# Patient Record
Sex: Female | Born: 1975 | Race: White | Hispanic: No | Marital: Married | Smoking: Former smoker
Health system: Southern US, Community
[De-identification: ages and names within clinical notes are randomized; demographics above are authoritative.]

## PROBLEM LIST (undated history)

## (undated) DIAGNOSIS — E119 Type 2 diabetes mellitus without complications: Secondary | ICD-10-CM

## (undated) DIAGNOSIS — M94261 Chondromalacia, right knee: Secondary | ICD-10-CM

## (undated) DIAGNOSIS — L649 Androgenic alopecia, unspecified: Secondary | ICD-10-CM

## (undated) DIAGNOSIS — T7840XA Allergy, unspecified, initial encounter: Secondary | ICD-10-CM

## (undated) DIAGNOSIS — Z8489 Family history of other specified conditions: Secondary | ICD-10-CM

## (undated) DIAGNOSIS — Z98811 Dental restoration status: Secondary | ICD-10-CM

## (undated) DIAGNOSIS — N809 Endometriosis, unspecified: Secondary | ICD-10-CM

## (undated) DIAGNOSIS — G4700 Insomnia, unspecified: Secondary | ICD-10-CM

## (undated) DIAGNOSIS — S8990XA Unspecified injury of unspecified lower leg, initial encounter: Secondary | ICD-10-CM

## (undated) HISTORY — DX: Endometriosis, unspecified: N80.9

## (undated) HISTORY — DX: Unspecified injury of unspecified lower leg, initial encounter: S89.90XA

## (undated) HISTORY — DX: Insomnia, unspecified: G47.00

## (undated) HISTORY — PX: OTHER SURGICAL HISTORY: SHX169

## (undated) HISTORY — DX: Allergy, unspecified, initial encounter: T78.40XA

---

## 2000-09-18 ENCOUNTER — Encounter: Admission: RE | Admit: 2000-09-18 | Discharge: 2000-09-18 | Payer: Self-pay | Admitting: Family Medicine

## 2000-10-19 ENCOUNTER — Encounter: Admission: RE | Admit: 2000-10-19 | Discharge: 2000-10-19 | Payer: Self-pay | Admitting: Family Medicine

## 2000-10-19 ENCOUNTER — Encounter: Payer: Self-pay | Admitting: Family Medicine

## 2000-10-27 ENCOUNTER — Encounter: Admission: RE | Admit: 2000-10-27 | Discharge: 2000-10-27 | Payer: Self-pay | Admitting: Family Medicine

## 2000-11-06 ENCOUNTER — Encounter: Admission: RE | Admit: 2000-11-06 | Discharge: 2000-11-06 | Payer: Self-pay | Admitting: Family Medicine

## 2001-04-09 ENCOUNTER — Encounter (INDEPENDENT_AMBULATORY_CARE_PROVIDER_SITE_OTHER): Payer: Self-pay | Admitting: Specialist

## 2001-04-09 ENCOUNTER — Encounter: Admission: RE | Admit: 2001-04-09 | Discharge: 2001-04-09 | Payer: Self-pay | Admitting: Family Medicine

## 2001-07-29 ENCOUNTER — Other Ambulatory Visit: Admission: RE | Admit: 2001-07-29 | Discharge: 2001-07-29 | Payer: Self-pay | Admitting: Obstetrics and Gynecology

## 2001-08-20 ENCOUNTER — Ambulatory Visit (HOSPITAL_COMMUNITY): Admission: RE | Admit: 2001-08-20 | Discharge: 2001-08-20 | Payer: Self-pay | Admitting: Obstetrics and Gynecology

## 2001-08-20 ENCOUNTER — Encounter (INDEPENDENT_AMBULATORY_CARE_PROVIDER_SITE_OTHER): Payer: Self-pay | Admitting: Specialist

## 2001-08-20 HISTORY — PX: DIAGNOSTIC LAPAROSCOPY: SUR761

## 2001-08-20 HISTORY — PX: CHROMOPERTUBATION: SHX6288

## 2002-01-31 ENCOUNTER — Other Ambulatory Visit: Admission: RE | Admit: 2002-01-31 | Discharge: 2002-01-31 | Payer: Self-pay | Admitting: Obstetrics and Gynecology

## 2002-09-02 ENCOUNTER — Inpatient Hospital Stay (HOSPITAL_COMMUNITY): Admission: AD | Admit: 2002-09-02 | Discharge: 2002-09-06 | Payer: Self-pay | Admitting: Obstetrics and Gynecology

## 2002-09-02 ENCOUNTER — Inpatient Hospital Stay (HOSPITAL_COMMUNITY): Admission: AD | Admit: 2002-09-02 | Discharge: 2002-09-02 | Payer: Self-pay | Admitting: Obstetrics and Gynecology

## 2002-09-03 ENCOUNTER — Encounter (INDEPENDENT_AMBULATORY_CARE_PROVIDER_SITE_OTHER): Payer: Self-pay

## 2002-10-18 ENCOUNTER — Other Ambulatory Visit: Admission: RE | Admit: 2002-10-18 | Discharge: 2002-10-18 | Payer: Self-pay | Admitting: Obstetrics and Gynecology

## 2004-10-04 ENCOUNTER — Ambulatory Visit (HOSPITAL_COMMUNITY): Admission: RE | Admit: 2004-10-04 | Discharge: 2004-10-04 | Payer: Self-pay | Admitting: Obstetrics and Gynecology

## 2004-10-25 ENCOUNTER — Other Ambulatory Visit: Admission: RE | Admit: 2004-10-25 | Discharge: 2004-10-25 | Payer: Self-pay | Admitting: Obstetrics and Gynecology

## 2008-09-11 ENCOUNTER — Encounter: Admission: RE | Admit: 2008-09-11 | Discharge: 2008-09-11 | Payer: Self-pay | Admitting: Orthopedic Surgery

## 2009-03-03 HISTORY — PX: KNEE ARTHROSCOPY: SHX127

## 2009-03-28 ENCOUNTER — Encounter: Admission: RE | Admit: 2009-03-28 | Discharge: 2009-03-28 | Payer: Self-pay | Admitting: Orthopedic Surgery

## 2009-04-04 ENCOUNTER — Encounter: Admission: RE | Admit: 2009-04-04 | Discharge: 2009-04-04 | Payer: Self-pay | Admitting: Orthopedic Surgery

## 2009-11-07 ENCOUNTER — Encounter: Admission: RE | Admit: 2009-11-07 | Discharge: 2009-11-07 | Payer: Self-pay | Admitting: Orthopaedic Surgery

## 2010-07-19 NOTE — Op Note (Signed)
[name] Mccarthy, [name] Mccarthy                          ACCOUNT NO.:  [id number]   MEDICAL RECORD NO.:  [id number]                   PATIENT TYPE:  INP   LOCATION:  9134                                 FACILITY:  [facility]   PHYSICIAN:  Dois Davenport A. Rivard, M.D.              DATE OF BIRTH:  [birth year]   DATE OF PROCEDURE:  09/03/2002  DATE OF DISCHARGE:                                 OPERATIVE REPORT   PREOPERATIVE DIAGNOSIS:  Intrauterine pregnancy at 41 weeks with  nonreassuring fetal heart rate.   POSTOPERATIVE DIAGNOSIS:  Intrauterine pregnancy at 41 weeks with  nonreassuring fetal heart rate.   ANESTHESIA:  Epidural, Dr. Jean Rosenthal.   PROCEDURE:  Primary low transverse cesarean section.   SURGEON:  Crist Fat. Rivard, M.D.   ASSISTANTRenaldo Reel. Latham, C.N.M.   ESTIMATED BLOOD LOSS:  500 mL.   DISPOSITION:  This is a 35 year old single white female, gravida 2, para 0,  aborta 1, who was admitted on September 02, 2002, at 40 weeks 5 days, presenting  with increased frequency and intensity of contractions since 1 o'clock in  the afternoon.  After her arrival at maternity admissions, she began leaking  greenish fluid around 7:45 p.m.  She is admitted for spontaneous rupture of  membranes with moderate meconium in early labor with a vaginal exam of 2+  cm, 90% effaced, vertex -1.  Her fetal heart rate at that time was reactive  and reassuring with occasional mild variable deceleration.  She was brought  to labor and delivery, and epidural was placed.  Post-epidural fetal heart  rate decreased to 70-80 beats per minute, which lasted for about six to  eight minutes but resolved with change of position and O2 mask.  She was  then 3 cm, 80% effaced, and vertex a -1, and thick, abundant meconium was  noted.  An IUPC was inserted at that time to start amnioinfusion, and a  scalp electrode was placed.  The fetal heart rate at that time was 130-140  baseline with no deceleration and good variability.  The  decision was made  to monitor closely and with 45-60 minutes of reactive and reassuring heart  rate, Pitocin per low dose may be started to achieve adequate [place]  units.  That was at 11 p.m.  At 3 a.m. on September 03, 2002, fetal heart rate  showed decreased variability with late-appearing decelerations.  Vaginal  exam was then 8-9 cm by nurse.  Amnioinfusion was then discontinued, Pitocin  was discontinued, and a change of position resolved the issue.  Pitocin was  restarted around 7 a.m. this morning for inadequate [place] units, less  than 150, with an unchanged cervical exam at 9 cm.  At that time fetal heart  rate was reassuring.  At 8 o'clock the baby showed a prolonged deceleration  at 70-80 beats per minute for eight minutes before I was called.  This  persisted despite discontinuing Pitocin, oxygen administration, change in  position, and bolus IV.  She was found to be complete at that time.  At my  arrival at 8:10, fetal heart rate was between 100 and 110 beats per minute  with decreased variability and late-appearing decelerations.  Contractions  were every three to four minutes, and the patient was found to be complete,  left OA presentation with posterior asynclitism, station +1 to +2.  At that  time the patient was consented for a trial of forceps delivery, and an  attempt to adequately place a Simpson forceps failed twice.  During that  attempt fetal heart rate went down to 70-80 beats per minute, and thick  meconium was again noted.  A decision was made to proceed with stat cesarean  section.  The patient arrived in the operating room at 8:35, the procedure  started at 8:37, and baby was born at 8:39.   PROCEDURE:  The patient was transferred to the OR table, prepped and draped  in a sterile fashion, and her bladder was already emptied with a Foley  catheter.  After assessing adequate level of anesthesia, we proceeded with a  Pfannenstiel incision, which was brought  down to the fascia.  The fascia was  incised transversely and the linea alba was dissected  Peritoneum was  entered bluntly.  Visceral peritoneum was entered sharply in the low  transverse fashion, allowing [place] to retract bladder flap and safely retract  bladder.  Myometrium was then entered in the low transverse fashion, first  sharply, then bluntly.  Amniotic fluid was thick meconium.  We assisted the  birth of a female infant at 8:39 in left OA presentation.  Mouth and nose  were suctioned with DeLee suction, baby was delivered, cord was clamped with  two Kelly clamps and sectioned, and baby was given to the pediatrician  present in the room.  We proceeded with drawing 20 mL of blood from the  umbilical vein as well as cord pH from the umbilical artery.  Placenta  detached and was expelled spontaneously.  It was complete.  Cord had three  vessels, and placenta was sent to pathology.   We proceeded with the closure of the myometrium in two layers, first with a  running locked suture of 0 Vicryl, then with a Lembert suture of 0 Vicryl  covering the first one.  Hemostasis was assessed and adequate.  Both  paracolic gutters were cleansed, both tubes and ovaries normal.  The pelvis  was profusely irrigated with warm saline, and hemostasis was assessed and  adequate.  Under-fascia hemostasis was completed with cautery, and the  fascia was closed with two running sutures of 0 Vicryl meeting midline.  The  wound was irrigated with warm saline and hemostasis was completed with  cautery, and skin was closed with staples.   Instrument and sponge count was complete x2.  Estimated blood loss 500 mL.   A little girl born at 8:39, was named Trula Ore, received an Apgar of 8 at  one minute and 9 at five minutes, and had a cord pH of 7.20.  Her weight was  6 pounds 8 ounces.   Instrument and sponge count was complete x2.  The procedure was very well- tolerated by the patient, who is taken to the  recovery room in a well and  stable condition.  Crist Fat Rivard, M.D.    SAR/MEDQ  D:  09/03/2002  T:  09/03/2002  Job:  [id number]

## 2010-07-19 NOTE — H&P (Signed)
NAMEGIAH, FICKETT                          ACCOUNT NO.:  [id number]   MEDICAL RECORD NO.:  [id number]                   PATIENT TYPE:  INP   LOCATION:  9170                                 [facility]ACILITY:  [facility]   PHYSICIAN:  Dois Davenport A. Rivard, M.D.              DATE OF BIRTH:  1975/07/18   DATE OF ADMISSION:  DATE OF DISCHARGE:                                HISTORY & PHYSICAL   HISTORY OF PRESENT ILLNESS:  [name] Mccarthy is a 35 year old single white  female gravida 2, para 0, 0-0-1-0 at 60 and 5/7 weeks who presents  complaining of increasing  frequency and intensity of uterine contractions  since about 1 p.m. today. While she has been in maternity admissions this  evening  she has noted leaking of a small amount of green fluid that started  around 7:45 p.m. She denies any nausea or vomiting, headaches or visual  disturbances.   Her pregnancy has been followed  at [facility] by the MD  service and has been essentially uncomplicated although at risk for a  history of abnormal Pap smear, history of endometriosis, first trimester  spotting. She was evaluated earlier today  for complaints of uterine  contractions and was found to be 1.5 cm and 80% and did not change  her  cervix over an hour and a half of observation. She is currently requesting  an epidural for labor. Her group B Strep is negative.   OBSTETRIC/GYNECOLOGIC HISTORY:  Her ob/gyn history, she is a gravida 2, para  0, 0-0-1-0. She had an elective abortion in 1999 with no complications. In  May 2003 she had an abnormal Pap smear and was treated with a colposcopy and  her Pap smears have been normal since then. She has a history of  endometriosis. She had a diagnostic laparoscopy and also had excision of  peritubal cyst and was noted to have 1 nonpatent accessory fallopian tube.   ALLERGIES:  No known drug allergies.   MEDICAL HISTORY:  She reports having had the usual childhood diseases. She  has no medical  history or problems, but does report an occasional urinary  tract infection. She reports a history of smoking prior to this pregnancy,  but none with this pregnancy. Her only surgery was a diagnostic laparoscopy  and the anesthetic had an effect on her blood pressure and gave her  prolonged lethargy.   FAMILY HISTORY:  Significant for maternal grandmother with chronic  hypertension, father with a blood clot.  Genetic history is negative.   SOCIAL HISTORY:  She is single. The father of the baby is [name] Mccarthy and  they are [place]. She denies any illicit drug use, alcohol or smoking with  this pregnancy.   LABORATORY DATA:  Her prenatal labs were blood type A positive, antibody  screen is negative. Her syphilis is negative. Rubella is positive. Hepatitis  B surface antigen is not currently  available. Her HIV is nonreactive. GC and  Chlamydia and group B Strep were all negative at 36 weeks.   PHYSICAL EXAMINATION:  VITAL SIGNS:  Stable, afebrile.  HEENT:  Grossly within normal limits.  HEART:  Regular rate and rhythm.  CHEST:  Clear.  BREASTS: Soft, nontender.  ABDOMEN:  Gravid with uterine contractions every 3 minutes. Fetal heart rate  is overall reactive and reassuring with some occasional  mild variables  noted with uterine contractions. Her uterine contractions are every 3  minutes.  PELVIC:  Her cervix is 2.5 cm, 90% vertex, -1 with moderate meconium stained  fluid.  EXTREMITIES:  Within normal limits.   ASSESSMENT:  1. Intrauterine pregnancy at term.  2. Early labor.  3. Meconium stained fluid.  4. Negative group B Strep.   PLAN:  Per consult with Dr. Dois Davenport Rivard it to admit to labor and delivery  and to follow routine physician orders.     Concha Pyo. Duplantis, C.N.M.              Crist Fat Rivard, M.D.    SJD/MEDQ  D:  09/02/2002  T:  09/02/2002  Job:  [id number]

## 2010-07-19 NOTE — Op Note (Signed)
[facility]  Patient:    [name] Mccarthy, [name] Mccarthy Visit Number: [id number] MRN: [id number]          Service Type: DSU Location: [facility] Attending Physician:  Esmeralda Arthur Dictated by:   Silverio Lay, M.D. Proc. Date: 08/20/01 Admit Date:  08/20/2001 Discharge Date: 08/20/2001                             Operative Report  PREOPERATIVE DIAGNOSES:       Chronic pelvic pain.  POSTOPERATIVE DIAGNOSES:      Two left peritubal cysts, one left nonpatent accessory fallopian tube, posterior cul-de-sac endometriosis, right distal tubal occlusion.  ANESTHESIA:                   General.  PROCEDURE:                    Diagnostic laparoscopy, chromopertubation, peritoneal biopsies x3, excision of two peritubal cysts and one nonpatent accessory fallopian tube, cauterization of endometriosis.  SURGEON:                      Silverio Lay, M.D.  ESTIMATED BLOOD LOSS:         Minimal.  PROCEDURE:                    After being informed of the planned procedure with possible complications including bleeding, infection, injury to bowels, bladder, or ureters, need for laparotomy, informed consent was obtained.  The patient was taken to OR #8 and given general anesthesia with endotracheal intubation.  She was placed in the lithotomy, prepped and draped in a sterile fashion and her bladder was emptied with an in-and-out catheter.  GYN examination reveals an anteverted uterus normal in size and shape and two normal adnexa.  A speculum is placed in the vagina and the anterior lip of the cervix is grasped to place the [employer].  We then proceed with infiltration of the umbilical area using 8 cc of Marcaine 0.25 and performed a semi-elliptical incision of 10 mm with knife.  We insert the Veress needle and insufflate a pneumoperitoneum with a CO2 at a maximum pressure of 15 mmHg. The Veress needle is removed and a 10 mm trocar is inserted with the laparoscope.   Observation:  Anterior cul-de-sac is normal.  Uterus is normal. Right tube appears normal.  Right ovary is normal.  Left tube has two peritubal cysts, one measuring 0.5 cm, the other one measuring 1 cm with a bluish tint, possible torsion?.  We also see at the distal third of the fallopian tube a structure measuring approximately 0.5-1 cm similar to a tube with a fimbrial end.  This appears to be an accessory fallopian tube. Fimbriae on both sides appear normal.  There are no pelvic adhesions. Posterior cul-de-sac is remarkable for thickening of the peritoneum with two small bluish colored lesions of 2 mm each.  This is mostly located close to the right uterosacral ligament.  On the left uterosacral ligament a red lesion is seen also compatible with possible endometriosis.  The rest of the observation is normal including a normal appendix, normal liver, normal gallbladder, normal peritoneal surfaces, and normal omentum.  We decide to place a 5 mm suprapubic trocar after infiltrating with 2 cc of Marcaine 0.25 and this is done under direct visualization.  We start with chromopertubation and note a normal patency of  the left tube, a nonpatency of the left accessory fallopian tube, and a nonpatency of the right tube despite a filling which appears to be rapid up to the fimbrial end of the right tube.  Despite partial occlusion of the left tube, we were not able to see any dye leakage from the right tube.  The methylene blue is then removed from the pelvis and we decide to proceed with excision of the two peritubal cysts and the left nonpatent accessory tube.  This is performed with cauterization of the stalk and removal with scissors.  All specimens are removed through the 5 mm trocar and labeled appropriately.  We perform biopsies in the posterior cul-de-sac and around the right uterosacral ligament with a biopsy forceps, one of the scar tissue and two of the bluish lesions.  We then cauterize  all these areas with the bipolar cautery as well as the lesion previously noted on the left uterosacral ligament.  Hemostasis is adequate.  The 5 mm trocar is removed under direct visualization and we note a satisfactory hemostasis.  We then remove all instruments, evacuate the pneumoperitoneum, and remove the 10 mm trocar.  The fascia of the 10 mm incision is closed with a simple suture of 0 Vicryl and the skin of both incisions is closed with subcuticular sutures of 4-0 Vicryl as well as Steri-Strips.  Instruments and sponge count is complete x2. Estimated blood loss is minimal.  Procedure is well tolerated by the patient who is taken to the recovery room in a well and stable condition. Dictated by:   Silverio Lay, M.D. Attending Physician:  Esmeralda Arthur DD:  08/20/01 TD:  08/22/01 Job: [id number]

## 2010-07-19 NOTE — Discharge Summary (Signed)
NAMESYMIA, HERDT                          ACCOUNT NO.:  [id number]   MEDICAL RECORD NO.:  [id number]                   PATIENT TYPE:  INP   LOCATION:  9134                                 [facility]   PHYSICIAN:  Osborn Coho, M.D.                DATE OF BIRTH:  1975-07-18   DATE OF ADMISSION:  09/02/2002  DATE OF DISCHARGE:  09/06/2002                                 DISCHARGE SUMMARY   ADMITTING DIAGNOSES:  1. Intrauterine pregnancy at term.  2. Early labor.  3. Meconium-stained fluid.   DISCHARGE DIAGNOSES:  1. Intrauterine pregnancy at 41 weeks.  2. Nonreassuring fetal heart rate.  3. Anemia.   PROCEDURE:  1. Primary low transverse cesarean section.  2. Epidural anesthesia.   HOSPITAL COURSE:  Ms. Gritz is a 34 year old gravida 2 para 0-0-1-0 at 62  and five-sevenths weeks who was admitted in early labor on the evening of  September 02, 2002.  She presented with ruptured membranes noting to have meconium-  stained fluid.  Her cervix was 2.5 cm, 90%, vertex at a -1 station.  Fetal  heart rate was reactive and reassuring with occasional mild variables.  She  was admitted for early labor care.  Her pregnancy had been remarkable for:  1. History of abnormal Pap.  2. History of endometriosis.  3. First trimester spotting.   An epidural was placed per patient request.  She had a brief fetal  bradycardia episode after the epidural was placed which resolved with the  usual measures.  The cervix was 3 cm at that time.  Thick meconium was  noted.  Pressure catheter was inserted to start an amnioinfusion and a scalp  electrode was placed.  Good variability and stable baselines were noted.  Early in the morning of September 03, 2002 there were late decelerations noted.  The cervix was 8-9 cm.  Close observation of fetal heart rate was noted.  These did resolve.  At approximately 8:01 a.m. the patient had another  episode of bradycardia for eight minutes down to 70-80 beats per  minute.  The cervix was complete at that time.  Pitocin was turned off.  The usual  measures were noted.  Fetal heart rate resolved to 100-110 with late-  appearing decelerations.  The cervix was complete with the vertex at a +1 to  +2 station with an LOA presentation with posterior asynclitism.  Dr. Estanislado Pandy  discussed with the patient a possible trial of forceps.  The patient did  consent to that.  Dr. Estanislado Pandy attempted to adequately place Simpson forceps  but this was unsuccessful x2.  Fetal heart rate again was down in the 70s to  80s with thick meconium.  The decision was made to proceed to STAT cesarean  section.  This was accomplished over the next four minutes and at 8:39 a.m.  a viable female by the name of Trula Ore was delivered  with Apgars of 8 and  9; weight was 6 pounds 8 ounces; cord pH was 7.2  There was no clear  evidence of the etiology of the decelerations.  Estimated blood loss was 500  mL.  The infant was taken to the full-term nursery; mother was taken to the  recovery room in good condition.  Postoperative day #1 the patient was doing  well; baby was breastfeeding but was having some difficulty latching on.  The patient elected to defer contraceptive decision until six weeks  postpartum.  Her temperature maximum was 100.3 but then that did resolve to  afebrile the rest of the day.  Hemoglobin was 9.7, down from 12.6.  Wbc  count was 15.3, down from 18.8.  Platelets were within normal limits.  The  rest of her physical exam was within normal limits.  By postoperative day #2  the patient was doing well.  She did have some slight dizziness with  ambulation.  Orthostatics were checked and were stable.  Hemoglobin was  repeated and was 10.0.  The infant was taken to NICU late in the afternoon  of September 05, 2002 secondary to jaundice and poor hydration.  By postoperative  day #3 the patient was doing well.  The infant was doing well in NICU.  The  patient did have some issues  with vulvar itching, low back pain, and sore  nipples.  However, these were reviewed with the patient and were found to be  within normal limits.  No additional medications were noted.  She also had  some small hemorrhoids.  These were to be treated with the usual measures.  Her incision was clean, dry, and intact.  She had passed a bowel movement.  Her bleeding was normal.  Her uterus was well involuted.  The decision was  made to discharge the patient home and remove her staples and apply Steri-  Strips.  Multiple questions were reviewed with the patient regarding her  infant.  She was referred to the NICU staff for further information.  The  patient was deemed to have received the full benefit of her hospital stay  and was discharged home on September 06, 2002.   DISCHARGE INSTRUCTIONS:  Per [facility] handout.   DISCHARGE MEDICATIONS:  1. Motrin 600 mg p.o. q.6h. p.r.n. pain.  2. Tylox one to two p.o. q.3-4h. p.r.n. pain.  3. Dibucaine to be applied on a p.r.n. basis.  4. ProctoFoam which the patient has at home may also be used.  5. The patient may use a little external Monistat for vulvar itching.   DISCHARGE FOLLOW-UP:  Will occur in six weeks at [facility].     Renaldo Reel Emilee Hero, C.N.M.                   Osborn Coho, M.D.    VLL/MEDQ  D:  09/06/2002  T:  09/06/2002  Job:  [id number]

## 2011-02-06 ENCOUNTER — Ambulatory Visit (INDEPENDENT_AMBULATORY_CARE_PROVIDER_SITE_OTHER): Payer: Self-pay | Admitting: Family Medicine

## 2011-02-06 DIAGNOSIS — M25569 Pain in unspecified knee: Secondary | ICD-10-CM

## 2011-03-20 ENCOUNTER — Ambulatory Visit: Payer: Self-pay | Admitting: Family Medicine

## 2011-05-01 ENCOUNTER — Ambulatory Visit (INDEPENDENT_AMBULATORY_CARE_PROVIDER_SITE_OTHER): Payer: Self-pay | Admitting: Family Medicine

## 2011-05-01 ENCOUNTER — Encounter: Payer: Self-pay | Admitting: Family Medicine

## 2011-05-01 DIAGNOSIS — S8990XA Unspecified injury of unspecified lower leg, initial encounter: Secondary | ICD-10-CM

## 2011-05-01 DIAGNOSIS — S99919A Unspecified injury of unspecified ankle, initial encounter: Secondary | ICD-10-CM

## 2011-05-01 HISTORY — DX: Unspecified injury of unspecified lower leg, initial encounter: S89.90XA

## 2011-05-01 NOTE — Progress Notes (Signed)
36 yo married woman from [place] who is orthodox and married a Catholic man.  She has one daughter who is 10 and has straight A's.  Her family lives in [place] and has disowned her because she married outside the faith.  She cannot work because of the knee.  She hasn't spoken with her husband, a truck driver, in weeks.  She feels she has no friends.  She belongs to [facility], but just goes through the motions and doesn't feel connected to the [employer].  Her daughter was recently confirmed though, so she continues to attend services.  She's thinking about leaving her husband, but she has nowhere to go.  Her husband even took her off his car insurance coverage.  O:  [name] Mccarthy cries throughout the interview.  She does perk up when talking about her daughter and is committed to seeing her daughter go through school.  She agrees to return in a month and start Prozac 20 qd with 6 refills  The knee is mildly swollen, nontender, with good ligamentous stability and no crepitus throughout ROM.  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

## 2011-06-05 ENCOUNTER — Ambulatory Visit: Payer: Self-pay | Admitting: Family Medicine

## 2011-07-03 ENCOUNTER — Ambulatory Visit: Payer: Self-pay | Admitting: Family Medicine

## 2011-08-28 ENCOUNTER — Ambulatory Visit: Payer: Self-pay | Admitting: Family Medicine

## 2011-10-09 ENCOUNTER — Ambulatory Visit: Payer: Self-pay | Admitting: Family Medicine

## 2011-12-11 ENCOUNTER — Ambulatory Visit: Payer: Self-pay | Admitting: Family Medicine

## 2012-02-12 ENCOUNTER — Ambulatory Visit: Payer: Self-pay | Admitting: Family Medicine

## 2014-01-02 DIAGNOSIS — N979 Female infertility, unspecified: Secondary | ICD-10-CM | POA: Insufficient documentation

## 2014-07-10 ENCOUNTER — Ambulatory Visit (INDEPENDENT_AMBULATORY_CARE_PROVIDER_SITE_OTHER): Payer: BLUE CROSS/BLUE SHIELD | Admitting: Family Medicine

## 2014-07-10 ENCOUNTER — Ambulatory Visit (INDEPENDENT_AMBULATORY_CARE_PROVIDER_SITE_OTHER): Payer: BLUE CROSS/BLUE SHIELD

## 2014-07-10 VITALS — BP 132/60 | HR 71 | Temp 98.1°F | Resp 16 | Ht 61.0 in | Wt 165.4 lb

## 2014-07-10 DIAGNOSIS — M25561 Pain in right knee: Secondary | ICD-10-CM

## 2014-07-10 MED ORDER — MELOXICAM 7.5 MG PO TABS: 7.5000 mg | ORAL_TABLET | Freq: Every day | ORAL | Status: DC

## 2014-07-10 NOTE — Progress Notes (Signed)
Subjective:  This chart was scribed for Elvina SidleKurt Lauenstein MD,  by Veverly FellsHatice Demirci,scribe, at [facility].  This patient was seen in room 11and the patient's care was started at 8:54 PM.    Patient ID: [name] Mccarthy, female    DOB: 1976/01/06, 39 y.o.   MRN: [id number] Chief Complaint  Patient presents with   Knee Pain    right    HPI  HPI Comments: [name] Mccarthy is a 39 y.o. female who presents to the [facility] complaining of right knee pain onset yesterday when she was lifting a 24 pack of bottled waters. She has associated symptoms of numbness and she has swelling in her knee but says that this is regular.   Patient had knee surgery in 2013 and states that she has always had issues with her knee.  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 She has no other complaints today.      Patient Active Problem List   Diagnosis Date Noted   Knee injury 05/01/2011   History reviewed. No pertinent past medical history. Past Surgical History  Procedure Laterality Date   Other surgical history      knee 2011   Allergies  Allergen Reactions   Hydrocodone-Homatropine     Increase heart rate, severe vomiting, itchy skin.   Oxycodone     Nausea, vomiting, increase heart rate.   Prior to Admission medications   Medication Sig Start Date End Date Taking? Authorizing Provider  ibuprofen (ADVIL,MOTRIN) 100 MG tablet Take 100 mg by mouth every 6 (six) hours as needed.   Yes Historical Provider, MD   History   Social History   Marital Status: Single    Spouse Name: N/A   Number of Children: N/A   Years of Education: N/A   Occupational History   Not on file.   Social History Main Topics   Smoking status: Former Smoker   Smokeless tobacco: Not on file   Alcohol Use: No   Drug Use: No   Sexual Activity: Not on file   Other Topics Concern   Not on file     Social History Narrative      Review of Systems  Constitutional: Negative for fever and chills.  HENT: Negative for nosebleeds, postnasal drip and rhinorrhea.   Respiratory: Negative for cough, choking and shortness of breath.   Gastrointestinal: Negative for nausea and vomiting.  Musculoskeletal: Positive for arthralgias. Negative for neck pain and neck stiffness.  Neurological: Positive for numbness.       Objective:   Physical Exam  Constitutional: She appears well-developed.  HENT:  Head: Normocephalic.  Eyes: Pupils are equal, round, and reactive to light.  Neck: Normal range of motion.  Cardiovascular: Normal rate.   Pulmonary/Chest: Effort normal.  Musculoskeletal:  Right leg: she is mildly diffusely tender but the ligaments are in tact She has no definite bony tenderness.  Full ROM with small amount of crepitous No overlying skin rash.   Skin: Skin is warm. No rash noted.    Filed Vitals:   07/10/14 2028  BP: 132/60  Pulse: 71  Temp: 98.1 F (36.7 C)  TempSrc: Oral  Resp: 16  Height: 5\' 1"  (1.549 m)  Weight: 165 lb 6.4 oz (75.025 kg)  SpO2: 99%   [facility] reading (PRIMARY) by  Dr. Milus GlazierLauenstein:  2 screws are present with small amount of effusion, no  other bony abnormality noted..      Assessment & Plan:   This chart was scribed in my presence and reviewed by me personally.    ICD-9-CM ICD-10-CM   1. Right knee pain 719.46 M25.561 DG Knee Complete 4 Views Right     DG Knee Complete 4 Views Right     meloxicam (MOBIC) 7.5 MG tablet     Signed, Elvina SidleKurt Lauenstein, MD

## 2014-07-10 NOTE — Patient Instructions (Signed)
I am referring you to Dr. Darrick PennaFields because I think there may be some new exercises and other devices may really help this knee heal without further surgery. Also he is excellent at ultrasound diagnostics to try to figure out where the problem is that is causing so much inflammation

## 2014-07-13 ENCOUNTER — Ambulatory Visit (INDEPENDENT_AMBULATORY_CARE_PROVIDER_SITE_OTHER): Payer: BLUE CROSS/BLUE SHIELD | Admitting: Physician Assistant

## 2014-07-13 VITALS — BP 114/80 | HR 72 | Temp 98.0°F | Resp 16 | Ht 61.0 in | Wt 166.8 lb

## 2014-07-13 DIAGNOSIS — M25561 Pain in right knee: Secondary | ICD-10-CM

## 2014-07-13 MED ORDER — TRAMADOL HCL 50 MG PO TABS: 50.0000 mg | ORAL_TABLET | Freq: Three times a day (TID) | ORAL | Status: DC | PRN

## 2014-07-13 NOTE — Patient Instructions (Signed)

## 2014-07-13 NOTE — Progress Notes (Signed)
   Subjective:    Patient ID: [name] Mccarthy, female    DOB: 1976/02/18, 39 y.o.   MRN: [id number]  HPI Patient presents for a stronger medication for right knee pain that was evaluated 3 days ago. Pain has been present for past 5 days following lifting 24 pack of bottled waters. Pain does not radiate, but is sharp and achy in quality. Feels numbness in knee at times. Says that swelling is unchanged. Denies loss of function/ROM, weakness, or gait change. Had surgery on knee in 2013 and has had problems ever since. Rested knee initially, but went back to work a day earlier than recommended and had 7/10 pain throughout day. Is taking Mobic and icing knee 10x daily.   Med allergy to hydrocodone and oxycodone. Has used tramadol in the past without any allergy sx.    Review of Systems  Constitutional: Negative.   Musculoskeletal: Positive for myalgias, joint swelling and arthralgias. Negative for back pain and gait problem.  Skin: Negative for color change.       Objective:   Physical Exam  Constitutional: She is oriented to person, place, and time. She appears well-developed and well-nourished. No distress.  Blood pressure 114/80, pulse 72, temperature 98 F (36.7 C), temperature source Oral, resp. rate 16, height 5\' 1"  (1.549 m), weight 166 lb 12.8 oz (75.66 kg), last menstrual period 06/14/2014, SpO2 99 %.  HENT:  Head: Normocephalic and atraumatic.  Right Ear: External ear normal.  Left Ear: External ear normal.  Eyes: Conjunctivae are normal. Right eye exhibits no discharge. Left eye exhibits no discharge. No scleral icterus.  Pulmonary/Chest: Effort normal.  Musculoskeletal: Normal range of motion. She exhibits edema.       Right knee: She exhibits swelling, effusion and deformity. She exhibits normal range of motion, no ecchymosis, no laceration, no erythema, no LCL laxity, normal patellar mobility, no bony tenderness, normal meniscus and no MCL laxity. Tenderness found.       Right  ankle: Normal.       Right upper leg: Normal.       Left upper leg: Normal.       Right lower leg: Normal.       Left lower leg: Normal.  Neurological: She is alert and oriented to person, place, and time. She has normal reflexes. No cranial nerve deficit. She exhibits normal muscle tone. Coordination normal.  Skin: Skin is warm and dry. No rash noted. She is not diaphoretic. No erythema. No pallor.      Assessment & Plan:  1. Right knee pain Continue mobic daily and ice/cold pack 3-4x 15-20 min daily. Rest and elevate. Should wear brace when going to drive [place] and when going to be on feet/walking more.  - traMADol (ULTRAM) 50 MG tablet; Take 1 tablet (50 mg total) by mouth every 8 (eight) hours as needed.  Dispense: 30 tablet; Refill: 0   Ileene Allie PA-C  [facility] 07/13/2014 4:10 PM

## 2014-07-17 ENCOUNTER — Ambulatory Visit: Payer: Self-pay | Admitting: Family Medicine

## 2014-08-07 ENCOUNTER — Ambulatory Visit (INDEPENDENT_AMBULATORY_CARE_PROVIDER_SITE_OTHER): Payer: BLUE CROSS/BLUE SHIELD | Admitting: Family Medicine

## 2014-08-07 ENCOUNTER — Encounter: Payer: Self-pay | Admitting: Family Medicine

## 2014-08-07 VITALS — BP 129/81 | Ht 61.0 in | Wt 164.0 lb

## 2014-08-07 DIAGNOSIS — S8991XA Unspecified injury of right lower leg, initial encounter: Secondary | ICD-10-CM

## 2014-08-08 NOTE — Assessment & Plan Note (Addendum)
I think her right knee was never really totally rehabbed correctly and she never regained full flexion. Her symptoms of knee pain and swelling seem much more troublesome for her and her knee exam would indicate. I suspect there is some component of psychosocial interaction with her knee pain in her other life stressors. I tried to reassure her today that I think her knee will be fine with some diligent rehabilitation exercises. Upper totally want her to work on her knee flexion and for her to be more confident in in the knee area gave her general knee rehabilitation program and hope for focus on lateral step ups and squats. I like to see her back in one month. I spent greater than 50% of our 30 minute office visit in counseling and education as well as reassurance regarding her knee and the fact that I don't think she needs any type of additional surgery or intervention right now. Certainly she does not need to think about a knee replacement.  If she doesn't have some improvement, I would give some consideration to cortical sterile injection although I would not want to be this a recurrent treatment option for her. Potentially, we can also repeat MRI of her knee.

## 2014-08-08 NOTE — Progress Notes (Signed)
Patient ID: [name] Mccarthy, female   DOB: 1975/10/31, 39 y.o.   MRN: [id number]  [name] Mccarthy - 39 y.o. female MRN [id number]  Date of birth: 1975/10/31    SUBJECTIVE:     Right knee pain. Relates she's had problems with her right knee for many many years. Previously had some type of surgery done on the right knee and has had several imaging studies of her knees area does not recall a specific incident that injured her knee and has only had the one surgery. She has never had any corticosteroid-induced is in her knee.  Right knee swells daily and is so painful it keeps her from being ampicillin do her activities that she would like to do, including making it difficult for her to work full-time. Says she cannot stand very long because of the knee pain. Says that it is interfering with many aspects of her life. The knee occasionally clicks and sometimes feels like it's going to catch but does not really give way. ROS:     No unusual weight change, no numbness in her lower extremities, she does have some reported swelling in the right knee on a daily basis but not any swelling in the left knee and no ankle swelling. Denies calf pain. Denies foot pain. The right knee is constantly painful except at night  PERTINENT  PMH / PSH FH / / SH:  Past Medical, Surgical, Social, and Family History Reviewed & Updated in the EMR.  Pertinent findings include:  2011 right knee arthroscopic repair of anterior cruciate ligament. MRI 2 right knee, left knee 1.  MRI right knee 2011, IMPRESSION: 1. ACL repair noted with intact graft. 2. 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 3. Trace knee effusion. 4. Low-level subcutaneous edema anterior to the patellar tendon and tibial tubercle, without osseous edema or significant tendinopathy. Complete x-ray right knee, May 2016: Results:IMPRESSION: 1. No acute findings. 2. Previous ACL repair.  History of anxiety and marital  stressors  OBJECTIVE: BP 129/81 mmHg  Ht 5\' 1"  (1.549 m)  Wt 164 lb (74.39 kg)  BMI 31.00 kg/m2  LMP 06/14/2014  Physical Exam:  Vital signs are reviewed. GEN.: Well-developed female, no acute distress, tearful at times. Seems very anxious and upset when she talks about her knee. KNEES: Bilaterally they're symmetrical. There is no effusion noted. There is a well-healed portal marks from prior arthroscopy on the right. She has full extension and flexion to about 120. Passively I can flex her past 120 and she complains of some pain and stiffness that reproduces her symptoms. Negative for patellar crepitus, mild pain with patellar compression on the right. No excessive patellar mobility is noted in [name] seem to be symmetrical. Quadricep development bilaterally symmetrical but the VMO is not particularly well-developed. Patellar tracking appears to be within the groove. She has diffuse pain to palpation but no specific joint line tenderness. Ligaments intact to varus and valgus stress. Negative Apley grind test.  ULTRASOUND: Very's small, less than 10 mL, amount of fluid in the suprapatellar pouch. The medial meniscus is seen well and has no abnormalities. The lateral meniscus is a little more difficult to see and has a small fragmented area noted but is 90% intact. Popliteal space is benign. Quadricep and patellar tendons are benign.  ASSESSMENT & PLAN:  See problem based charting & AVS for pt instructions.

## 2014-08-11 ENCOUNTER — Ambulatory Visit (INDEPENDENT_AMBULATORY_CARE_PROVIDER_SITE_OTHER): Payer: BLUE CROSS/BLUE SHIELD | Admitting: Physician Assistant

## 2014-08-11 VITALS — BP 120/76 | HR 85 | Temp 98.4°F | Resp 17 | Ht 62.0 in | Wt 169.0 lb

## 2014-08-11 DIAGNOSIS — R300 Dysuria: Secondary | ICD-10-CM | POA: Diagnosis not present

## 2014-08-11 LAB — POCT UA - MICROSCOPIC ONLY
CASTS, UR, LPF, POC: NEGATIVE
CRYSTALS, UR, HPF, POC: NEGATIVE
MUCUS UA: NEGATIVE
YEAST UA: NEGATIVE

## 2014-08-11 LAB — POCT URINALYSIS DIPSTICK
BILIRUBIN UA: NEGATIVE
Glucose, UA: NEGATIVE
Ketones, UA: NEGATIVE
Leukocytes, UA: NEGATIVE
Nitrite, UA: NEGATIVE
PH UA: 6
PROTEIN UA: NEGATIVE
SPEC GRAV UA: 1.01
UROBILINOGEN UA: 0.2

## 2014-08-11 MED ORDER — SULFAMETHOXAZOLE-TRIMETHOPRIM 800-160 MG PO TABS: 1.0000 | ORAL_TABLET | Freq: Two times a day (BID) | ORAL | Status: DC

## 2014-08-11 NOTE — Progress Notes (Signed)
Patient ID: [name] Mccarthy, female    DOB: 1975-09-10, 39 y.o.   MRN: [id number]  PCP: [name] Rao, PA-C  Subjective:   Chief Complaint  Patient presents with  . Urinary Tract Infection    started 2 weeks ago    HPI Presents for evaluation of urinary symptoms.  She reports two weeks of urinary frequency, urgency and burning. She was not able to take time from work to come in for evaluation, so has been using OTC AZO, which helps temporarily. She has taken multiple OTC tests for UTI and reports that they have all been positive. Gets a UTI about once a year, always feels like this.  Denies vaginal discharge, back/belly pain, fever, chills, nausea.  Sexually active with one partner. No contraception due to infertility (reports she's been told she will not be able to become pregnant again due to poor egg quality). Her daughter is 86. LMP 07/14/2014.    Review of Systems As above.    Patient Active Problem List   Diagnosis Date Noted  . 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 Knee injury 05/01/2011     Prior to Admission medications   Medication Sig Start Date End Date Taking? Authorizing Provider  ibuprofen (ADVIL,MOTRIN) 100 MG tablet Take 100 mg by mouth every 6 (six) hours as needed.   Yes Historical Provider, MD  traMADol (ULTRAM) 50 MG tablet Take 1 tablet (50 mg total) by mouth every 8 (eight) hours as needed. 07/13/14  Yes [name] R Brewington, PA-C     Allergies  Allergen Reactions  . Hydrocodone-Homatropine     Increase heart rate, severe vomiting, itchy skin.  Marland Kitchen Hydrocodone-Homatropine     Syrup  . Oxycodone     Nausea, vomiting, increase heart rate.       Objective:  Physical Exam  Constitutional: She is oriented to person, place, and time. Vital signs are normal. She appears well-developed and well-nourished. No distress.  BP 120/76 mmHg  Pulse 85  Temp(Src) 98.4 F (36.9 C) (Oral)  Resp 17  Ht  (1.575 m)  Wt 169 lb (76.658 kg)   BMI 30.90 kg/m2  SpO2 98%  LMP 07/14/2014   HENT:  Head: Normocephalic and atraumatic.  Cardiovascular: Normal rate, regular rhythm and normal heart sounds.   Pulmonary/Chest: Effort normal and breath sounds normal.  Abdominal: Soft. Normal appearance and bowel sounds are normal. She exhibits no distension and no mass. There is no hepatosplenomegaly. There is tenderness in the suprapubic area. There is no rigidity, no rebound, no guarding, no CVA tenderness, no tenderness at McBurney's point and negative Murphy's sign. No hernia.  Musculoskeletal: Normal range of motion.       Lumbar back: Normal.  Neurological: She is alert and oriented to person, place, and time.  Skin: Skin is warm and dry. No rash noted. She is not diaphoretic. No pallor.  Psychiatric: She has a normal mood and affect. Her speech is normal and behavior is normal. Judgment normal.   Results for orders placed or performed in visit on 08/11/14  POCT UA - Microscopic Only  Result Value Ref Range   WBC, Ur, HPF, POC 1-4    RBC, urine, microscopic 1-2    Bacteria, U Microscopic trace    Mucus, UA neg    Epithelial cells, urine per micros 4-8    Crystals, Ur, HPF, POC neg    Casts, Ur, LPF, POC neg    Yeast, UA neg   POCT urinalysis dipstick  Result  Value Ref Range   Color, UA yellow    Clarity, UA clear    Glucose, UA neg    Bilirubin, UA neg    Ketones, UA neg    Spec Grav, UA 1.010    Blood, UA trace-lysed    pH, UA 6.0    Protein, UA neg    Urobilinogen, UA 0.2    Nitrite, UA neg    Leukocytes, UA Negative            Assessment & Plan:   1. Dysuria Suspect UTI, so will treat pending culture results. - POCT UA - Microscopic Only - POCT urinalysis dipstick - sulfamethoxazole-trimethoprim (BACTRIM DS,SEPTRA DS) 800-160 MG per tablet; Take 1 tablet by mouth 2 (two) times daily.  Dispense: 10 tablet; Refill: 0 - Urine culture   [name] Bras, PA-C Physician Assistant-Certified [facility] .

## 2014-08-11 NOTE — Patient Instructions (Signed)

## 2014-08-12 LAB — URINE CULTURE

## 2014-11-28 ENCOUNTER — Ambulatory Visit (INDEPENDENT_AMBULATORY_CARE_PROVIDER_SITE_OTHER): Payer: BLUE CROSS/BLUE SHIELD | Admitting: Family Medicine

## 2014-11-28 ENCOUNTER — Ambulatory Visit
Admission: RE | Admit: 2014-11-28 | Discharge: 2014-11-28 | Disposition: A | Discharge status: Home / Self Care | Payer: BLUE CROSS/BLUE SHIELD | Source: Ambulatory Visit | Attending: Family Medicine | Admitting: Family Medicine

## 2014-11-28 VITALS — BP 144/70 | HR 77 | Temp 98.9°F | Resp 18 | Ht 61.5 in | Wt 171.2 lb

## 2014-11-28 DIAGNOSIS — N949 Unspecified condition associated with female genital organs and menstrual cycle: Secondary | ICD-10-CM | POA: Diagnosis not present

## 2014-11-28 DIAGNOSIS — N921 Excessive and frequent menstruation with irregular cycle: Secondary | ICD-10-CM

## 2014-11-28 DIAGNOSIS — N979 Female infertility, unspecified: Secondary | ICD-10-CM

## 2014-11-28 DIAGNOSIS — N809 Endometriosis, unspecified: Secondary | ICD-10-CM | POA: Diagnosis not present

## 2014-11-28 DIAGNOSIS — N9489 Other specified conditions associated with female genital organs and menstrual cycle: Secondary | ICD-10-CM

## 2014-11-28 DIAGNOSIS — N938 Other specified abnormal uterine and vaginal bleeding: Secondary | ICD-10-CM

## 2014-11-28 LAB — POCT URINALYSIS DIP (MANUAL ENTRY)
Bilirubin, UA: NEGATIVE
Glucose, UA: NEGATIVE
Ketones, POC UA: NEGATIVE
NITRITE UA: NEGATIVE
PH UA: 5.5
PROTEIN UA: NEGATIVE
Spec Grav, UA: >=1.03
Urobilinogen, UA: 0.2

## 2014-11-28 LAB — TSH: TSH: 2.029 u[IU]/mL (ref 0.350–4.500)

## 2014-11-28 LAB — POCT WET + KOH PREP
TRICH BY WET PREP: ABSENT
Yeast by KOH: ABSENT
Yeast by wet prep: ABSENT

## 2014-11-28 LAB — POCT CBC
Granulocyte percent: 68.9 %G (ref 37–80)
HCT, POC: 40.9 % (ref 37.7–47.9)
Hemoglobin: 13.3 g/dL (ref 12.2–16.2)
Lymph, poc: 1.8 (ref 0.6–3.4)
MCH: 29.9 pg (ref 27–31.2)
MCHC: 32.5 g/dL (ref 31.8–35.4)
MCV: 92.1 fL (ref 80–97)
MID (CBC): 0.3 (ref 0–0.9)
MPV: 6.2 fL (ref 0–99.8)
PLATELET COUNT, POC: 331 10*3/uL (ref 142–424)
POC Granulocyte: 4.8 (ref 2–6.9)
POC LYMPH %: 26.3 % (ref 10–50)
POC MID %: 4.8 % (ref 0–12)
RBC: 4.44 M/uL (ref 4.04–5.48)
RDW, POC: 12.7 %
WBC: 6.9 10*3/uL (ref 4.6–10.2)

## 2014-11-28 LAB — POC MICROSCOPIC URINALYSIS (UMFC)

## 2014-11-28 LAB — POCT URINE PREGNANCY: PREG TEST UR: NEGATIVE

## 2014-11-28 MED ORDER — INDOMETHACIN 50 MG PO CAPS: 50.0000 mg | ORAL_CAPSULE | Freq: Three times a day (TID) | ORAL | Status: DC | PRN

## 2014-11-28 NOTE — Patient Instructions (Addendum)
You have an Ultrasound Appt. At [facility] ([address] This afternoon at 3:45. If you have any questions you can call them at [phone].  Metrorrhagia  Metrorrhagia is uterine bleeding at irregular intervals, especially between menstrual periods.  CAUSES   Dysfunctional uterine bleeding.  Uterine lining growing outside the uterus (endometriosis).  Embryo adhering to uterine wall (implantation).  Pregnancy growing in the fallopian tubes (ectopic pregnancy).  Miscarriage.  Menopause.  Cancer of the reproduction organs.  Certain drugs such as hormonal contraceptives.  Inherited bleeding disorders.  Trauma.  Uterine fibroids.  Sexually transmitted diseases (STDs).  Polycystic ovarian disease. DIAGNOSIS  A history will be taken.  A physical exam will be performed.  Other tests may include:  Blood tests.  A pregnancy test.  An ultrasound of the abdomen and pelvis.  A biopsy of the uterine lining.  AMRI or CT scan of the abdomen and pelvis. TREATMENT Treatment will depend on the cause. HOME CARE INSTRUCTIONS   Take all medicines as directed by your caregiver. Do not change or switch medicines without talking to your caregiver.  Take all iron supplements exactly as directed by your caregiver. Iron supplements help to replace the iron your body loses from irregular bleeding.If you become constipated, increase the amount of fiber, fruits, and vegetables in your diet.  Do not take aspirin or medicines that contain aspirin for 1 week before your menstrual period or during your menstrual period. Aspirin may increase the bleeding.  Rest as much as possible if you change your sanitary pad or tampon more than once every 2 hours.  Eat well-balanced meals including foods high in iron, such as green leafy vegetables, red meat, liver, eggs, and whole-grain breads and cereals.  Do not try to lose weight until the abnormal bleeding is controlled and your  blood iron level is back to normal. SEEK MEDICAL CARE IF:   You have nausea and vomiting, or you cannot keep foods down.  You feel dizzy or have diarrhea while taking medicine.  You have any problems that may be related to the medicine you are taking. SEEK IMMEDIATE MEDICAL CARE IF:   You have a fever.  You develop chills.  You become lightheaded or faint.  You need to change your sanitary pad or tampon more than once an hour.  Your bleeding becomesheavy.  You begin to pass clots or tissue. MAKE SURE YOU:   Understand these instructions.  Will watch your condition.  Will get help right away if you are not doing well or get worse. Document Released: 02/17/2005 Document Revised: 05/12/2011 Document Reviewed: 09/16/2010 [facility] Patient Information 2015 [place], [place]. This information is not intended to replace advice given to you by your health care provider. Make sure you discuss any questions you have with your health care provider.

## 2014-11-28 NOTE — Progress Notes (Signed)
Subjective:  This chart was scribed for Norberto Sorenson, MD by Broadus John, Medical Scribe. This patient was seen in Room 11 and the patient's care was started at 11:05 AM.   Patient ID: [name] Mccarthy, female    DOB: 06-25-1975, 39 y.o.   MRN: [id number]  Chief Complaint  Patient presents with  . Abdominal Pain    lower abdominal pain  - spotting in the last 2 weeks    HPI HPI Comments: [name] Mccarthy is a 39 y.o. female who presents to [facility] complaining of menstrual problems, onset 2 weeks ago.  Pt states that she has been spotting since her period last menstrual period was about 2 weeks ago (09/11), she states that cycle had no change than normal. She indicates that her cycle usually last 3-5 days, and the severity of it depends on the time. Pt notes associates symptoms of severe lower abdominal pain that causes her difficulties with walking, fatigue, and dizziness. Pt denies abnormal vaginal discharge, dysuria, bowel complications, nausea, vomiting, flank pain, fever, and chills. Pt notes that she is currently sexually active, she does not indicates that sexual intercourse is painful. Pt states that she has been trying to conceive for years. Pt is not on any regular medications.   [place]    Patient Active Problem List   Diagnosis Date Noted  . 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 Knee injury 05/01/2011   History reviewed. No pertinent past medical history. Past Surgical History  Procedure Laterality Date  . Other surgical history      knee 2011   Allergies  Allergen Reactions  . Hydrocodone-Homatropine     Increase heart rate, severe vomiting, itchy skin.  Marland Kitchen Hydrocodone-Homatropine     Syrup  . Oxycodone     Nausea, vomiting, increase heart rate.   Prior to Admission medications   Medication Sig Start Date End Date Taking? Authorizing Provider  ibuprofen (ADVIL,MOTRIN) 100 MG tablet Take 100 mg by mouth every 6 (six) hours as  needed.   Yes Historical Provider, MD   Social History   Social History  . Marital Status: Significant Other    Spouse Name: n/a  . Number of Children: 1  . Years of Education: N/A   Occupational History  . Paralegal    Social History Main Topics  . Smoking status: Former Games developer  . Smokeless tobacco: Never Used  . Alcohol Use: No  . Drug Use: No  . Sexual Activity: Not on file   Other Topics Concern  . Not on file   Social History Narrative   From [place]   Came to the [place] in 2000   Lives with her boyfriend (since 2000) and daughter.    Review of Systems  Constitutional: Positive for activity change and fatigue. Negative for fever, chills and diaphoresis.  Gastrointestinal: Positive for abdominal pain. Negative for nausea, vomiting, diarrhea and constipation.  Genitourinary: Positive for vaginal bleeding, menstrual problem and pelvic pain. Negative for dysuria, urgency, frequency, flank pain, vaginal discharge, difficulty urinating, vaginal pain and dyspareunia.  Musculoskeletal: Positive for arthralgias.  Neurological: Positive for dizziness.      Objective:   Physical Exam  Constitutional: She is oriented to person, place, and time. She appears well-developed and well-nourished. No distress.  HENT:  Head: Normocephalic and atraumatic.  Eyes: EOM are normal. Pupils are equal, round, and reactive to light.  Neck: Neck supple.  Cardiovascular: Normal rate.   Pulmonary/Chest: Effort normal.  Abdominal: Bowel sounds are normal. There is tenderness (generalized tenderness but most severe in the suprapubic and  LLQ areas).  Genitourinary: Vagina normal.  Normal labia majora and minora. Cervix with small amount dark mucoid blood coming from the os.  No cervical motion tenderness, but uterine felt firm and severely tender to palpation, but not fixed or enlarged. Question of left adnexal mass, though less adnexal tenderness of uterine.    Neurological: She is alert and  oriented to person, place, and time. No cranial nerve deficit.  Skin: Skin is warm and dry.  Psychiatric: She has a normal mood and affect. Her behavior is normal.  Nursing note and vitals reviewed.  BP 144/70 mmHg  Pulse 77  Temp(Src) 98.9 F (37.2 C) (Oral)  Resp 18  Ht 5' 1.5" (1.562 m)  Wt 171 lb 3.2 oz (77.656 kg)  BMI 31.83 kg/m2  SpO2 99%  LMP 11/12/2014     Assessment & Plan:  . 1. Metrorrhagia   2. Dysfunctional uterine bleeding - suspect sxs due to recurrence of endometriosis so will get [place] and pt will f/u w/ gyn.  3. Endometriosis   4. Female infertility, secondary - appt with fertility specialist next wk.  5. Uterine pain     Orders Placed This Encounter  Procedures  . Urine culture  . GC/Chlamydia Probe Amp  . US Pelvis Complete    171LBS/NO NEEDS/INS/BCBS/HB & ABBY    Standing Status: Future     Number of Occurrences: 1     Standing Expiration Date: 01/28/2016    Order Specific Question:  Reason for Exam (SYMPTOM  OR DIAGNOSIS REQUIRED)    Answer:  severe uterine pain and metrorrhagia x 2 wks - r/o endometrial cancer, ovarian cyst, molar pregnancy    Order Specific Question:  Preferred imaging location?    Answer:  [facility]  . US Transvaginal Non-OB    Standing Status: Future     Number of Occurrences: 1     Standing Expiration Date: 01/28/2016    Order Specific Question:  Reason for Exam (SYMPTOM  OR DIAGNOSIS REQUIRED)    Answer:  severe uterine pain and metrorrhagia x 2 wks - r/o endometrial cancer, ovarian cyst, molar pregnancy    Order Specific Question:  Preferred imaging location?    Answer:  [facility]  . TSH  . POCT CBC  . POCT urine pregnancy  . POCT urinalysis dipstick  . POCT Microscopic Urinalysis ([facility])  . POCT Wet + KOH Prep ([facility])    Meds ordered this encounter  Medications  . indomethacin (INDOCIN) 50 MG capsule    Sig: Take 1 capsule (50 mg total) by mouth 3 (three) times daily as needed.    Dispense:  60  capsule    Refill:  0    I personally performed the services described in this documentation, which was scribed in my presence. The recorded information has been reviewed and considered, and addended by me as needed.  Norberto Sorenson, MD MPH  By signing my name below, I, Rawaa Al Rifaie, attest that this documentation has been prepared under the direction and in the presence of Norberto Sorenson, MD.  Broadus John, Medical Scribe. 11/28/2014.  11:17 AM.  Results for orders placed or performed in visit on 11/28/14  Urine culture  Result Value Ref Range   Colony Count 2,000 COLONIES/ML    Organism ID, Bacteria Insignificant Growth   GC/Chlamydia Probe Amp  Result Value Ref Range   CT  Probe RNA NEGATIVE    GC Probe RNA NEGATIVE   TSH  Result Value Ref Range   TSH 2.029 0.350 - 4.500 uIU/mL  POCT CBC  Result Value Ref Range   WBC 6.9 4.6 - 10.2 K/uL   Lymph, poc 1.8 0.6 - 3.4   POC LYMPH PERCENT 26.3 10 - 50 %L   MID (cbc) 0.3 0 - 0.9   POC MID % 4.8 0 - 12 %M   POC Granulocyte 4.8 2 - 6.9   Granulocyte percent 68.9 37 - 80 %G   RBC 4.44 4.04 - 5.48 M/uL   Hemoglobin 13.3 12.2 - 16.2 g/dL   HCT, POC 40.9 81.1 - 47.9 %   MCV 92.1 80 - 97 fL   MCH, POC 29.9 27 - 31.2 pg   MCHC 32.5 31.8 - 35.4 g/dL   RDW, POC 91.4 %   Platelet Count, POC 331 142 - 424 K/uL   MPV 6.2 0 - 99.8 fL  POCT urine pregnancy  Result Value Ref Range   Preg Test, Ur Negative Negative  POCT urinalysis dipstick  Result Value Ref Range   Color, UA yellow yellow   Clarity, UA clear clear   Glucose, UA negative negative   Bilirubin, UA negative negative   Ketones, POC UA negative negative   Spec Grav, UA >=1.030    Blood, UA moderate (A) negative   pH, UA 5.5    Protein Ur, POC negative negative   Urobilinogen, UA 0.2    Nitrite, UA Negative Negative   Leukocytes, UA Trace (A) Negative  POCT Microscopic Urinalysis ([facility])  Result Value Ref Range   WBC,UR,HPF,POC Moderate (A) None WBC/hpf   RBC,UR,HPF,POC Few  (A) None RBC/hpf   Bacteria Moderate (A) None   Mucus Present (A) Absent   Epithelial Cells, UR Per Microscopy Moderate (A) None cells/hpf  POCT Wet + KOH Prep ([facility])  Result Value Ref Range   Yeast by KOH Absent Present, Absent   Yeast by wet prep Absent Present, Absent   WBC by wet prep Moderate (A) None, Few   Clue Cells Wet Prep HPF POC None None   Trich by wet prep Absent Present, Absent   Bacteria Wet Prep HPF POC Few None, Few   Epithelial Cells By [employer] Pref ([facility]) Many (A) None, Few   RBC,UR,HPF,POC Few (A) None RBC/hpf

## 2014-11-29 LAB — URINE CULTURE

## 2014-11-29 LAB — GC/CHLAMYDIA PROBE AMP
CT PROBE, AMP APTIMA: NEGATIVE
GC PROBE AMP APTIMA: NEGATIVE

## 2014-11-30 ENCOUNTER — Encounter: Payer: Self-pay | Admitting: Family Medicine

## 2014-11-30 ENCOUNTER — Ambulatory Visit (INDEPENDENT_AMBULATORY_CARE_PROVIDER_SITE_OTHER): Payer: BLUE CROSS/BLUE SHIELD | Admitting: Family Medicine

## 2014-11-30 VITALS — BP 112/60 | HR 86 | Temp 98.8°F | Resp 18 | Ht 61.5 in | Wt 173.0 lb

## 2014-11-30 DIAGNOSIS — N809 Endometriosis, unspecified: Secondary | ICD-10-CM | POA: Diagnosis not present

## 2014-11-30 DIAGNOSIS — R102 Pelvic and perineal pain: Secondary | ICD-10-CM | POA: Diagnosis not present

## 2014-11-30 DIAGNOSIS — N921 Excessive and frequent menstruation with irregular cycle: Secondary | ICD-10-CM

## 2014-11-30 LAB — POCT CBC
Granulocyte percent: 67.5 %G (ref 37–80)
HCT, POC: 40.7 % (ref 37.7–47.9)
Hemoglobin: 13.2 g/dL (ref 12.2–16.2)
Lymph, poc: 1.6 (ref 0.6–3.4)
MCH: 29.9 pg (ref 27–31.2)
MCHC: 32.5 g/dL (ref 31.8–35.4)
MCV: 92.1 fL (ref 80–97)
MID (CBC): 0.4 (ref 0–0.9)
MPV: 6.1 fL (ref 0–99.8)
PLATELET COUNT, POC: 307 10*3/uL (ref 142–424)
POC Granulocyte: 4.1 (ref 2–6.9)
POC LYMPH %: 26 % (ref 10–50)
POC MID %: 6.5 % (ref 0–12)
RBC: 4.42 M/uL (ref 4.04–5.48)
RDW, POC: 12.7 %
WBC: 6 10*3/uL (ref 4.6–10.2)

## 2014-11-30 NOTE — Progress Notes (Signed)
[facility] [address], [place] [address] [phone]  Date:  11/30/2014   Name:  [name] Mccarthy   DOB:  Jul 28, 1975   MRN:  [id number]  PCP:  Hinton Rao, PA-C    Chief Complaint: Follow-up   History of Present Illness:  [name] Mccarthy is a 39 y.o. very pleasant female patient who presents with the following:  Seen here 2 days ago with concern about menorrhagia- note from that visit is not yet complete.  They did start indomethacin 50 TID, she also had an ultrasound that was normal.  Her GYN is Dr. Gwenlyn Fudge.   She reports that she is bleeding heavily again over the last 2 days.  She may change her pad every 3-4 hours. She is passing clots as well.   The clots are new to her.  Her menses had been ok until this month- since the 11th of this month she has had more consistent bleeding.  She also has a history of endometriosis.    She did vomit this am- once.   She is able to eat ok.   She has noted some loose stools She has not been aware of a fever but has felt hot and had chills.   She has not had this much pain from her endometriosis in the past  History of C/S and exlap X2  Results for orders placed or performed in visit on 11/28/14  Urine culture  Result Value Ref Range   Colony Count 2,000 COLONIES/ML    Organism ID, Bacteria Insignificant Growth   GC/Chlamydia Probe Amp  Result Value Ref Range   CT Probe RNA NEGATIVE    GC Probe RNA NEGATIVE   TSH  Result Value Ref Range   TSH 2.029 0.350 - 4.500 uIU/mL  POCT CBC  Result Value Ref Range   WBC 6.9 4.6 - 10.2 K/uL   Lymph, poc 1.8 0.6 - 3.4   POC LYMPH PERCENT 26.3 10 - 50 %L   MID (cbc) 0.3 0 - 0.9   POC MID % 4.8 0 - 12 %M   POC Granulocyte 4.8 2 - 6.9   Granulocyte percent 68.9 37 - 80 %G   RBC 4.44 4.04 - 5.48 M/uL   Hemoglobin 13.3 12.2 - 16.2 g/dL   HCT, POC 65.7 84.6 - 47.9 %   MCV 92.1 80 - 97 fL   MCH, POC 29.9 27 - 31.2 pg   MCHC 32.5 31.8 - 35.4 g/dL   RDW,  POC 96.2 %   Platelet Count, POC 331 142 - 424 K/uL   MPV 6.2 0 - 99.8 fL  POCT urine pregnancy  Result Value Ref Range   Preg Test, Ur Negative Negative  POCT urinalysis dipstick  Result Value Ref Range   Color, UA yellow yellow   Clarity, UA clear clear   Glucose, UA negative negative   Bilirubin, UA negative negative   Ketones, POC UA negative negative   Spec Grav, UA >=1.030    Blood, UA moderate (A) negative   pH, UA 5.5    Protein Ur, POC negative negative   Urobilinogen, UA 0.2    Nitrite, UA Negative Negative   Leukocytes, UA Trace (A) Negative  POCT Microscopic Urinalysis ([facility])  Result Value Ref Range   WBC,UR,HPF,POC Moderate (A) None WBC/hpf   RBC,UR,HPF,POC Few (A) None RBC/hpf   Bacteria Moderate (A) None   Mucus Present (A) Absent   Epithelial Cells, UR Per Microscopy Moderate (  A) None cells/hpf  POCT Wet + KOH Prep ([facility])  Result Value Ref Range   Yeast by KOH Absent Present, Absent   Yeast by wet prep Absent Present, Absent   WBC by wet prep Moderate (A) None, Few   Clue Cells Wet Prep HPF POC None None   Trich by wet prep Absent Present, Absent   Bacteria Wet Prep HPF POC Few None, Few   Epithelial Cells By [employer] Pref ([facility]) Many (A) None, Few   RBC,UR,HPF,POC Few (A) None RBC/hpf     Patient Active Problem List   Diagnosis Date Noted  . 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 Knee injury 05/01/2011    History reviewed. No pertinent past medical history.  Past Surgical History  Procedure Laterality Date  . Other surgical history      knee 2011    Social History  Substance Use Topics  . Smoking status: Former Games developer  . Smokeless tobacco: Never Used  . Alcohol Use: No    Family History  Problem Relation Age of Onset  . Arthritis Mother     Allergies  Allergen Reactions  . Hydrocodone-Homatropine     Increase heart rate, severe vomiting, itchy skin.  Marland Kitchen Hydrocodone-Homatropine     Syrup  . Oxycodone     Nausea, vomiting,  increase heart rate.    Medication list has been reviewed and updated.  Current Outpatient Prescriptions on File Prior to Visit  Medication Sig Dispense Refill  . ibuprofen (ADVIL,MOTRIN) 100 MG tablet Take 100 mg by mouth every 6 (six) hours as needed.    . indomethacin (INDOCIN) 50 MG capsule Take 1 capsule (50 mg total) by mouth 3 (three) times daily as needed. 60 capsule 0   No current facility-administered medications on file prior to visit.    Review of Systems:  As per HPI- otherwise negative.   Physical Examination: Filed Vitals:   11/30/14 0828  BP: 112/60  Pulse: 86  Temp: 98.8 F (37.1 C)  Resp: 18   Filed Vitals:   11/30/14 0828  Height: 5' 1.5" (1.562 m)  Weight: 173 lb (78.472 kg)   Body mass index is 32.16 kg/(m^2). Ideal Body Weight: Weight in (lb) to have BMI = 25: 134.2  GEN: WDWN, NAD, Non-toxic, A & O x 3, obese HEENT: Atraumatic, Normocephalic. Neck supple. No masses, No LAD. Ears and Nose: No external deformity. CV: RRR, No M/G/R. No JVD. No thrill. No extra heart sounds. PULM: CTA B, no wheezes, crackles, rhonchi. No retractions. No resp. distress. No accessory muscle use. ABD: S, ND, +BS. No rebound. No HSM.  She has tenderness over the lower pelvis/ suprapubic area.  No RLQ or LLQ tenderness  EXTR: No c/c/e NEURO Normal gait.  PSYCH: Normally interactive. Conversant. Not depressed or anxious appearing.  Calm demeanor.   Results for orders placed or performed in visit on 11/30/14  POCT CBC  Result Value Ref Range   WBC 6.0 4.6 - 10.2 K/uL   Lymph, poc 1.6 0.6 - 3.4   POC LYMPH PERCENT 26.0 10 - 50 %L   MID (cbc) 0.4 0 - 0.9   POC MID % 6.5 0 - 12 %M   POC Granulocyte 4.1 2 - 6.9   Granulocyte percent 67.5 37 - 80 %G   RBC 4.42 4.04 - 5.48 M/uL   Hemoglobin 13.2 12.2 - 16.2 g/dL   HCT, POC 16.1 09.6 - 47.9 %   MCV 92.1 80 - 97 fL   MCH, POC  29.9 27 - 31.2 pg   MCHC 32.5 31.8 - 35.4 g/dL   RDW, POC 11.9 %   Platelet Count, POC 307  142 - 424 K/uL   MPV 6.1 0 - 99.8 fL    Assessment and Plan: Menorrhagia with irregular cycle - Plan: POCT CBC  Pelvic pain in female  Endometriosis  We were able to get her an appt with her OBG today- she will attend this appt.   CBC is stable Did not repeat pelvic exam as she is going to her OBG today  Signed Abbe Amsterdam, MD

## 2014-11-30 NOTE — Patient Instructions (Signed)
You will be seen by your OB-GYN office today- appt at 10:30, please arrive by [facility]:?   Address: [address], [place], [place] [address]  Phone: [phone]  Your red cell counts are stable from yesterday  If your OBGYN feels that you need a CT scan I am glad to help arrange this for you

## 2014-12-02 DIAGNOSIS — E2839 Other primary ovarian failure: Secondary | ICD-10-CM | POA: Insufficient documentation

## 2014-12-20 ENCOUNTER — Other Ambulatory Visit: Payer: Self-pay | Admitting: Obstetrics and Gynecology

## 2014-12-20 DIAGNOSIS — R109 Unspecified abdominal pain: Secondary | ICD-10-CM

## 2014-12-25 ENCOUNTER — Ambulatory Visit
Admission: RE | Admit: 2014-12-25 | Discharge: 2014-12-25 | Disposition: A | Discharge status: Home / Self Care | Payer: BLUE CROSS/BLUE SHIELD | Source: Ambulatory Visit | Attending: Obstetrics and Gynecology | Admitting: Obstetrics and Gynecology

## 2014-12-25 DIAGNOSIS — R109 Unspecified abdominal pain: Secondary | ICD-10-CM

## 2014-12-25 MED ORDER — IOPAMIDOL (ISOVUE-300) INJECTION 61%
100.0000 mL | Freq: Once | INTRAVENOUS | Status: AC | PRN
  Administered 2014-12-25: 100 mL via INTRAVENOUS

## 2015-03-08 DIAGNOSIS — N979 Female infertility, unspecified: Secondary | ICD-10-CM

## 2016-10-20 ENCOUNTER — Ambulatory Visit: Payer: BLUE CROSS/BLUE SHIELD | Admitting: Physician Assistant

## 2016-10-21 ENCOUNTER — Ambulatory Visit (INDEPENDENT_AMBULATORY_CARE_PROVIDER_SITE_OTHER): Payer: BLUE CROSS/BLUE SHIELD | Admitting: Physician Assistant

## 2016-10-21 ENCOUNTER — Encounter: Payer: Self-pay | Admitting: Physician Assistant

## 2016-10-21 VITALS — BP 120/88 | HR 76 | Temp 99.3°F | Resp 16 | Ht 61.75 in | Wt 177.6 lb

## 2016-10-21 DIAGNOSIS — N898 Other specified noninflammatory disorders of vagina: Secondary | ICD-10-CM

## 2016-10-21 DIAGNOSIS — B9689 Other specified bacterial agents as the cause of diseases classified elsewhere: Secondary | ICD-10-CM | POA: Diagnosis not present

## 2016-10-21 DIAGNOSIS — N760 Acute vaginitis: Secondary | ICD-10-CM

## 2016-10-21 DIAGNOSIS — L298 Other pruritus: Secondary | ICD-10-CM

## 2016-10-21 DIAGNOSIS — B3731 Acute candidiasis of vulva and vagina: Secondary | ICD-10-CM

## 2016-10-21 DIAGNOSIS — R350 Frequency of micturition: Secondary | ICD-10-CM

## 2016-10-21 DIAGNOSIS — B373 Candidiasis of vulva and vagina: Secondary | ICD-10-CM | POA: Diagnosis not present

## 2016-10-21 LAB — POCT WET + KOH PREP
Trich by wet prep: ABSENT
Yeast by KOH: ABSENT
Yeast by wet prep: ABSENT

## 2016-10-21 LAB — POC MICROSCOPIC URINALYSIS (UMFC): Mucus: ABSENT

## 2016-10-21 LAB — POCT URINALYSIS DIP (MANUAL ENTRY)
Bilirubin, UA: NEGATIVE
Glucose, UA: NEGATIVE mg/dL
Ketones, POC UA: NEGATIVE mg/dL
Leukocytes, UA: NEGATIVE
Nitrite, UA: NEGATIVE
Protein Ur, POC: NEGATIVE mg/dL
Spec Grav, UA: >=1.03 — AB (ref 1.010–1.025)
Urobilinogen, UA: 0.2 U/dL
pH, UA: 5.5 (ref 5.0–8.0)

## 2016-10-21 MED ORDER — METRONIDAZOLE 500 MG PO TABS: 500.0000 mg | ORAL_TABLET | Freq: Two times a day (BID) | ORAL | 0 refills | Status: DC

## 2016-10-21 MED ORDER — FLUCONAZOLE 150 MG PO TABS: 150.0000 mg | ORAL_TABLET | Freq: Once | ORAL | 1 refills | Status: AC

## 2016-10-21 NOTE — Patient Instructions (Addendum)
For vaginal dryness and Vaginal Burning: Replense or Vagisil feminine moisturizer, coconut oil  Glycerin also has anti-yeast properties Vaginal moisturizers are intended for use one or more times per week, not just during sexual activity. Many moisturizer products are available; examples include Replens, Me Again, Vagisil Feminine Moisturizer, Feminease, and K-Y SILK-E.   Thank you for coming in today. I hope you feel we met your needs.  Feel free to call PCP if you have any questions or further requests.  Please consider signing up for MyChart if you do not already have it, as this is a great way to communicate with me.  Best,  Whitney McVey, PA-C   Bacterial Vaginosis Bacterial vaginosis is an infection of the vagina. It happens when too many germs (bacteria) grow in the vagina. This infection puts you at risk for infections from sex (STIs). Treating this infection can lower your risk for some STIs. You should also treat this if you are pregnant. It can cause your baby to be born early. Follow these instructions at home: Medicines  Take over-the-counter and prescription medicines only as told by your doctor.  Take or use your antibiotic medicine as told by your doctor. Do not stop taking or using it even if you start to feel better. General instructions  If you your sexual partner is a woman, tell her that you have this infection. She needs to get treatment if she has symptoms. If you have a female partner, he does not need to be treated.  During treatment: ? Avoid sex. ? Do not douche. ? Avoid alcohol as told. ? Avoid breastfeeding as told.  Drink enough fluid to keep your pee (urine) clear or pale yellow.  Keep your vagina and butt (rectum) clean. ? Wash the area with warm water every day. ? Wipe from front to back after you use the toilet.  Keep all follow-up visits as told by your doctor. This is important. Preventing this condition  Do not douche.  Use only warm water to  wash around your vagina.  Use protection when you have sex. This includes: ? Latex condoms. ? Dental dams.  Limit how many people you have sex with. It is best to only have sex with the same person (be monogamous).  Get tested for STIs. Have your partner get tested.  Wear underwear that is cotton or lined with cotton.  Avoid tight pants and pantyhose. This is most important in summer.  Do not use any products that have nicotine or tobacco in them. These include cigarettes and e-cigarettes. If you need help quitting, ask your doctor.  Do not use illegal drugs.  Limit how much alcohol you drink. Contact a doctor if:  Your symptoms do not get better, even after you are treated.  You have more discharge or pain when you pee (urinate).  You have a fever.  You have pain in your belly (abdomen).  You have pain with sex.  Your bleed from your vagina between periods. Summary  This infection happens when too many germs (bacteria) grow in the vagina.  Treating this condition can lower your risk for some infections from sex (STIs).  You should also treat this if you are pregnant. It can cause early (premature) birth.  Do not stop taking or using your antibiotic medicine even if you start to feel better. This information is not intended to replace advice given to you by your health care provider. Make sure you discuss any questions you have with your  health care provider. Document Released: 11/27/2007 Document Revised: 11/03/2015 Document Reviewed: 11/03/2015 Elsevier Interactive Patient Education  2017 [employer].  IF you received an x-ray today, you will receive an invoice from [facility]. Please contact [facility] at [phone] with questions or concerns regarding your invoice.   IF you received labwork today, you will receive an invoice from [facility]. Please contact [facility] at [phone] with questions or concerns regarding your invoice.   Our  billing staff will not be able to assist you with questions regarding bills from these companies.  You will be contacted with the lab results as soon as they are available. The fastest way to get your results is to activate your My Chart account. Instructions are located on the last page of this paperwork. If you have not heard from [place] regarding the results in 2 weeks, please contact this office.

## 2016-10-21 NOTE — Progress Notes (Signed)
[name] Mccarthy  MRN: [id number] DOB: 05-22-1975  PCP: Patient, No Pcp Per  Subjective:  Pt is a pleasant 41 year old female who presents to clinic for vaginal itching x 10 days.  She has tried probiotics, chamomile tea, baking soda - not helping. Endorses some increased frequency in urination. She is not concerned today about STDs.  Patient denies fever, chills, vaginal discharge, back pain, headache, stomach ache and vaginal discharge.  She is premenopausal and endorses vaginal dryness.   Review of Systems  Constitutional: Negative for chills, fatigue and fever.  Respiratory: Negative for cough, shortness of breath and wheezing.   Cardiovascular: Negative for chest pain and palpitations.  Gastrointestinal: Negative for abdominal pain, diarrhea, nausea and vomiting.  Genitourinary: Positive for frequency. Negative for decreased urine volume, difficulty urinating, dysuria, enuresis, flank pain, hematuria, urgency, vaginal bleeding, vaginal discharge and vaginal pain.  Musculoskeletal: Negative for back pain.  Neurological: Negative for dizziness, weakness, light-headedness and headaches.    Patient Active Problem List   Diagnosis Date Noted  . 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 Knee injury 05/01/2011    Current Outpatient Prescriptions on File Prior to Visit  Medication Sig Dispense Refill  . ibuprofen (ADVIL,MOTRIN) 100 MG tablet Take 100 mg by mouth every 6 (six) hours as needed.    . indomethacin (INDOCIN) 50 MG capsule Take 1 capsule (50 mg total) by mouth 3 (three) times daily as needed. (Patient not taking: Reported on 10/21/2016) 60 capsule 0   No current facility-administered medications on file prior to visit.     Allergies  Allergen Reactions  . Hydrocodone-Homatropine     Increase heart rate, severe vomiting, itchy skin.  Marland Kitchen Hydrocodone-Homatropine     Syrup  . Oxycodone     Nausea, vomiting, increase heart rate.     Objective:  BP (!) 143/79   Pulse  76   Temp 99.3 F (37.4 C) (Oral)   Resp 16   Ht 5' 1.75" (1.568 m)   Wt 177 lb 9.6 oz (80.6 kg)   LMP 10/01/2016   SpO2 96%   BMI 32.75 kg/m   Physical Exam  Constitutional: She is oriented to person, place, and time and well-developed, well-nourished, and in no distress. No distress.  Cardiovascular: Normal rate, regular rhythm and normal heart sounds.   Abdominal: Soft. Normal appearance. There is no tenderness. There is no CVA tenderness.  Neurological: She is alert and oriented to person, place, and time. GCS score is 15.  Skin: Skin is warm and dry.  Psychiatric: Mood, memory, affect and judgment normal.  Vitals reviewed.  Results for orders placed or performed in visit on 10/21/16  POCT urinalysis dipstick  Result Value Ref Range   Color, UA yellow yellow   Clarity, UA clear clear   Glucose, UA negative negative mg/dL   Bilirubin, UA negative negative   Ketones, POC UA negative negative mg/dL   Spec Grav, UA >=2.951 (A) 1.010 - 1.025   Blood, UA trace-lysed (A) negative   pH, UA 5.5 5.0 - 8.0   Protein Ur, POC negative negative mg/dL   Urobilinogen, UA 0.2 0.2 or 1.0 E.U./dL   Nitrite, UA Negative Negative   Leukocytes, UA Negative Negative  POCT Microscopic Urinalysis ([facility])  Result Value Ref Range   WBC,UR,HPF,POC None None WBC/hpf   RBC,UR,HPF,POC None None RBC/hpf   Bacteria Few (A) None, Too numerous to count   Mucus Absent Absent   Epithelial Cells, UR Per Microscopy Few (A) None, Too numerous  to count cells/hpf  POCT Wet + KOH Prep  Result Value Ref Range   Yeast by KOH Absent Absent   Yeast by wet prep Absent Absent   WBC by wet prep None (A) Few   Clue Cells Wet Prep HPF POC Few (A) None   Trich by wet prep Absent Absent   Bacteria Wet Prep HPF POC Moderate (A) Few   Epithelial Cells By [employer] Pref ([facility]) Moderate (A) None, Few, Too numerous to count   RBC,UR,HPF,POC None None RBC/hpf    Assessment and Plan :  1. BV (bacterial vaginosis) 2. Vagina  itching 3. Increased frequency of urination - metroNIDAZOLE (FLAGYL) 500 MG tablet; Take 1 tablet (500 mg total) by mouth 2 (two) times daily with a meal. DO NOT CONSUME ALCOHOL WHILE TAKING THIS MEDICATION.  Dispense: 14 tablet; Refill: 0 - POCT Wet + KOH Prep - POCT urinalysis dipstick - POCT Microscopic Urinalysis ([facility]) - Urine Culture - urine negative for UTI. Culture is pending. Will treat today for BV. RTC in 5-7 days if no improvement. She agrees with plan.  4. Vaginal yeast infection - fluconazole (DIFLUCAN) 150 MG tablet; Take 1 tablet (150 mg total) by mouth once. Repeat if needed  Dispense: 2 tablet; Refill: 1 - H/o yeast infections with antibiotic use. OK to Rx diflucan.    Marco Collie, PA-C  [facility] 10/21/2016 9:55 AM

## 2016-10-22 LAB — URINE CULTURE

## 2017-01-05 ENCOUNTER — Encounter: Payer: Self-pay | Admitting: Family Medicine

## 2017-01-05 ENCOUNTER — Ambulatory Visit: Payer: BLUE CROSS/BLUE SHIELD | Admitting: Family Medicine

## 2017-01-05 ENCOUNTER — Other Ambulatory Visit: Payer: Self-pay

## 2017-01-05 VITALS — BP 100/64 | HR 76 | Temp 98.4°F | Resp 16 | Ht 61.75 in | Wt 160.4 lb

## 2017-01-05 DIAGNOSIS — L659 Nonscarring hair loss, unspecified: Secondary | ICD-10-CM | POA: Diagnosis not present

## 2017-01-05 DIAGNOSIS — L853 Xerosis cutis: Secondary | ICD-10-CM | POA: Diagnosis not present

## 2017-01-05 MED ORDER — HYDROCORTISONE VALERATE 0.2 % EX OINT: 1.0000 "application " | TOPICAL_OINTMENT | Freq: Every day | CUTANEOUS | 1 refills | Status: DC

## 2017-01-05 MED ORDER — AMMONIUM LACTATE 12 % EX CREA: TOPICAL_CREAM | CUTANEOUS | 0 refills | Status: DC | PRN

## 2017-01-05 NOTE — Patient Instructions (Addendum)
IF you received an x-ray today, you will receive an invoice from [facility]. Please contact [facility] at [phone] with questions or concerns regarding your invoice.   IF you received labwork today, you will receive an invoice from [facility]. Please contact [facility] at [phone] with questions or concerns regarding your invoice.   Our billing staff will not be able to assist you with questions regarding bills from these companies.  You will be contacted with the lab results as soon as they are available. The fastest way to get your results is to activate your My Chart account. Instructions are located on the last page of this paperwork. If you have not heard from [place] regarding the results in 2 weeks, please contact this office.     Hydrocortisone skin cream, ointment, lotion, or solution What is this medicine? HYDROCORTISONE (hye droe KOR ti sone) is a corticosteroid. It is used on the skin to reduce swelling, redness, itching, and allergic reactions. This medicine may be used for other purposes; ask your health care provider or pharmacist if you have questions. COMMON BRAND NAME(S): Ala-Cort, Ala-Scalp, Anusol HC, Aqua Glycolic HC, Balneol for Her, Caldecort, Cetacort, Cortaid, Cortaid Advanced, Cortaid Intensive Therapy, Cortaid Sensitive Skin, CortAlo, Corticaine, Corticool, Cortizone, Cortizone-10, Cortizone-10 Cooling Relief, Cortizone-10 Intensive Healing, Cortizone-10 Plus, Dermarest Dricort, Dermarest Eczema, DERMASORB HC Complete, Gly-Cort, Hycort, Hydro Skin, Hydroskin, Hytone, Instacort, Lacticare HC, Locoid, Locoid Lipocream, MiCort-HC, Monistat Complete Care Instant Itch Relief Cream, Neosporin Eczema, NuCort, Nutracort, NuZon, Pandel, Pediaderm HC, Penecort, Preparation H Hydrocortisone, Procto-Kit, Procto-Med HC, Proctocream-HC, Proctosol-HC, Proctozone-HC, Rederm, Sarnol-HC, Nurse, adult, Engineer, site, Contractor, Tucks HC, Human resources officer What should I tell  my health care provider before I take this medicine? They need to know if you have any of these conditions: -any active infection -diabetes -large areas of burned or damaged skin -skin wasting or thinning -an unusual or allergic reaction to hydrocortisone, corticosteroids, sulfites, other medicines, foods, dyes, or preservatives -pregnant or trying to get pregnant -breast-feeding How should I use this medicine? This medicine is for external use only. Do not take by mouth. Follow the directions on the prescription label. Wash your hands before and after use. Apply a thin film of medicine to the affected area. Do not cover with a bandage or dressing unless your doctor or health care professional tells you to. Do not use on healthy skin or over large areas of skin. Do not get this medicine in your eyes. If you do, rinse out with plenty of cool tap water. Do not to use more medicine than prescribed. Do not use your medicine more often than directed or for more than 14 days. Talk to your pediatrician regarding the use of this medicine in children. Special care may be needed. While this drug may be prescribed for children as young as 67 years of age for selected conditions, precautions do apply. Do not use this medicine for the treatment of diaper rash unless directed to do so by your doctor or health care professional. If applying this medicine to the diaper area of a child, do not cover with tight-fitting diapers or plastic pants. This may increase the amount of medicine that passes through the skin and increase the risk of serious side effects. Elderly patients are more likely to have damaged skin through aging, and this may increase side effects. This medicine should only be used for brief periods and infrequently in older patients. Overdosage: If you think you have taken too much of this medicine contact  a poison control center or emergency room at once. NOTE: This medicine is only for you. Do not share  this medicine with others. What if I miss a dose? If you miss a dose, use it as soon as you can. If it is almost time for your next dose, use only that dose. Do not use double or extra doses. What may interact with this medicine? Interactions are not expected. Do not use any other skin products on the affected area without asking your doctor or health care professional. This list may not describe all possible interactions. Give your health care provider a list of all the medicines, herbs, non-prescription drugs, or dietary supplements you use. Also tell them if you smoke, drink alcohol, or use illegal drugs. Some items may interact with your medicine. What should I watch for while using this medicine? Tell your doctor or health care professional if your symptoms do not start to get better within 7 days or if they get worse. Tell your doctor or health care professional if you are exposed to anyone with measles or chickenpox, or if you develop sores or blisters that do not heal properly. What side effects may I notice from receiving this medicine? Side effects that you should report to your doctor or health care professional as soon as possible: -allergic reactions like skin rash, itching or hives, swelling of the face, lips, or tongue -burning feeling on the skin -dark red spots on the skin -infection -lack of healing of skin condition -painful, red, pus filled blisters in hair follicles -thinning of the skin Side effects that usually do not require medical attention (report to your doctor or health care professional if they continue or are bothersome): -dry skin, irritation -unusual increased growth of hair on the face or body This list may not describe all possible side effects. Call your doctor for medical advice about side effects. You may report side effects to FDA at 1-800-FDA-1088. Where should I keep my medicine? Keep out of the reach of children. Store at room temperature between 15  and 30 degrees C (59 and 86 degrees F). Do not freeze. Throw away any unused medicine after the expiration date. NOTE: This sheet is a summary. It may not cover all possible information. If you have questions about this medicine, talk to your doctor, pharmacist, or health care provider.  2018 Elsevier/Gold Standard (2007-07-02 16:08:48)

## 2017-01-05 NOTE — Progress Notes (Signed)
Subjective:    Patient ID: [name] Mccarthy, female    DOB: 27-Oct-1975, 41 y.o.   MRN: [id number] Chief Complaint  Patient presents with  . Eczema    face is getting worse, seen a dermatologist last year     HPI   Was seen at dermatology specialists at [facility] - [name], [place]. Told to use otc moisturizers. Tried supp with collagen, fish oil, 3L of water/d,  Wants to know if there iss omething wrong with her blood that could be the cause.  Feels like she has somewthing over her face that prevents absorption. No rx medicines. Started in her 35s but worse ov erthe years - esp the past 3 years Uses a homemade soap amde from olive oil from [employer].  Put on ctream 2.5 hrs ago and now dry again.  Not in any specific areas, is the entire face. Ues coconut oil on skin and works well. Had tried everytype of otc moisturiziner w/o benefit.  Premenopausal Hormone imbalance - as perimenopausal - so taking estrogen supplements patch and dose has to frequently increase.   Started on calcium/vit D sev mos ago.     History reviewed. No pertinent past medical history. Past Surgical History:  Procedure Laterality Date  . CESAREAN SECTION    . KNEE SURGERY    . OTHER SURGICAL HISTORY     knee 2011   Current Outpatient Medications on File Prior to Visit  Medication Sig Dispense Refill  . Calcium Carb-Cholecalciferol (CALCIUM 500/D) 500-400 MG-UNIT CHEW Chew by mouth.    . estradiol (CLIMARA - DOSED IN MG/24 HR) 0.0375 mg/24hr patch Place 0.0375 mg onto the skin once a week.    Marland Kitchen ibuprofen (ADVIL,MOTRIN) 100 MG tablet Take 100 mg by mouth every 6 (six) hours as needed.     No current facility-administered medications on file prior to visit.    Allergies  Allergen Reactions  . Hydrocodone-Homatropine     Increase heart rate, severe vomiting, itchy skin.  Marland Kitchen Hydrocodone-Homatropine     Syrup  . Oxycodone     Nausea, vomiting, increase heart rate.   Family History  Problem  Relation Age of Onset  . Arthritis Mother    Social History   Socioeconomic History  . Marital status: Significant Other    Spouse name: n/a  . Number of children: 1  . Years of education: None  . Highest education level: None  Social Needs  . Financial resource strain: None  . Food insecurity - worry: None  . Food insecurity - inability: None  . Transportation needs - medical: None  . Transportation needs - non-medical: None  Occupational History  . Occupation: Paralegal  Tobacco Use  . Smoking status: Former Games developer  . Smokeless tobacco: Never Used  Substance and Sexual Activity  . Alcohol use: No  . Drug use: No  . Sexual activity: None  Other Topics Concern  . None  Social History Narrative   From [place]   Came to the [place] in 2000   Lives with her boyfriend (since 2000) and daughter.    Depression screen [facility] 2/9 10/21/2016 11/30/2014 11/28/2014 08/11/2014 08/07/2014  Decreased Interest 0 0 0 0 0  Down, Depressed, Hopeless 0 0 0 0 0  PHQ - 2 Score 0 0 0 0 0     Review of Systems See hpi    Objective:   Physical Exam  Constitutional: She is oriented to person, place, and time. She appears well-developed and  well-nourished. No distress.  HENT:  Head: Normocephalic and atraumatic.  Right Ear: External ear normal.  Left Ear: External ear normal.  Eyes: Conjunctivae are normal. No scleral icterus.  Neck: Normal range of motion. Neck supple. No thyromegaly present.  Cardiovascular: Normal rate, regular rhythm, normal heart sounds and intact distal pulses.  Pulmonary/Chest: Effort normal and breath sounds normal. No respiratory distress.  Musculoskeletal: She exhibits no edema.  Lymphadenopathy:    She has no cervical adenopathy.  Neurological: She is alert and oriented to person, place, and time.  Skin: Skin is warm and dry. She is not diaphoretic. No erythema.  Psychiatric: She has a normal mood and affect. Her behavior is normal.         BP 100/64   Pulse 76    Temp 98.4 F (36.9 C)   Resp 16   Ht 5' 1.75" (1.568 m)   Wt 160 lb 6.4 oz (72.8 kg)   SpO2 99%   BMI 29.58 kg/m   Assessment & Plan:   1. Dry skin dermatitis   2. Hair loss     Orders Placed This Encounter  Procedures  . CBC with Differential/Platelet  . Comprehensive metabolic panel  . Thyroid Panel With TSH    Meds ordered this encounter  Medications  . hydrocortisone valerate ointment (WEST-CORT) 0.2 %    Sig: Apply 1 application daily topically.    Dispense:  60 g    Refill:  1  . ammonium lactate (AMLACTIN) 12 % cream    Sig: Apply as needed topically for dry skin.    Dispense:  385 g    Refill:  0    [name], M.D.  Primary Care at [facility]  [place] [address] [place], [place] [address] [phone] phone [phone]) [phone] fax  01/08/17 8:12 AM

## 2017-01-06 LAB — CBC WITH DIFFERENTIAL/PLATELET
BASOS ABS: 0 10*3/uL (ref 0.0–0.2)
Basos: 0 %
EOS (ABSOLUTE): 0.1 10*3/uL (ref 0.0–0.4)
Eos: 2 %
Hematocrit: 39.8 % (ref 34.0–46.6)
Hemoglobin: 13.6 g/dL (ref 11.1–15.9)
Immature Grans (Abs): 0 10*3/uL (ref 0.0–0.1)
Immature Granulocytes: 0 %
LYMPHS ABS: 1.9 10*3/uL (ref 0.7–3.1)
LYMPHS: 35 %
MCH: 31.4 pg (ref 26.6–33.0)
MCHC: 34.2 g/dL (ref 31.5–35.7)
MCV: 92 fL (ref 79–97)
Monocytes Absolute: 0.3 10*3/uL (ref 0.1–0.9)
Monocytes: 6 %
NEUTROS ABS: 3.1 10*3/uL (ref 1.4–7.0)
Neutrophils: 57 %
Platelets: 255 10*3/uL (ref 150–379)
RBC: 4.33 x10E6/uL (ref 3.77–5.28)
RDW: 13.6 % (ref 12.3–15.4)
WBC: 5.4 10*3/uL (ref 3.4–10.8)

## 2017-01-06 LAB — COMPREHENSIVE METABOLIC PANEL
A/G RATIO: 2 (ref 1.2–2.2)
ALBUMIN: 4.5 g/dL (ref 3.5–5.5)
ALT: 14 IU/L (ref 0–32)
AST: 13 IU/L (ref 0–40)
Alkaline Phosphatase: 62 IU/L (ref 39–117)
BUN / CREAT RATIO: 10 (ref 9–23)
BUN: 7 mg/dL (ref 6–24)
CHLORIDE: 103 mmol/L (ref 96–106)
CO2: 21 mmol/L (ref 20–29)
Calcium: 9.5 mg/dL (ref 8.7–10.2)
Creatinine, Ser: 0.72 mg/dL (ref 0.57–1.00)
GFR calc non Af Amer: 104 mL/min/{1.73_m2} (ref >59)
GFR, EST AFRICAN AMERICAN: 119 mL/min/{1.73_m2} (ref >59)
Globulin, Total: 2.2 g/dL (ref 1.5–4.5)
Glucose: 105 mg/dL — ABNORMAL HIGH (ref 65–99)
POTASSIUM: 4.9 mmol/L (ref 3.5–5.2)
Sodium: 140 mmol/L (ref 134–144)
TOTAL PROTEIN: 6.7 g/dL (ref 6.0–8.5)

## 2017-01-06 LAB — THYROID PANEL WITH TSH
FREE THYROXINE INDEX: 2.1 (ref 1.2–4.9)
T3 Uptake Ratio: 24 % (ref 24–39)
T4, Total: 8.9 ug/dL (ref 4.5–12.0)
TSH: 1.63 u[IU]/mL (ref 0.450–4.500)

## 2017-02-16 ENCOUNTER — Encounter: Payer: Self-pay | Admitting: Family Medicine

## 2017-02-16 ENCOUNTER — Other Ambulatory Visit: Payer: Self-pay

## 2017-02-16 ENCOUNTER — Ambulatory Visit: Payer: BLUE CROSS/BLUE SHIELD | Admitting: Family Medicine

## 2017-02-16 VITALS — BP 106/66 | HR 84 | Temp 98.7°F | Ht 61.0 in | Wt 156.2 lb

## 2017-02-16 DIAGNOSIS — L853 Xerosis cutis: Secondary | ICD-10-CM | POA: Diagnosis not present

## 2017-02-16 DIAGNOSIS — R151 Fecal smearing: Secondary | ICD-10-CM

## 2017-02-16 DIAGNOSIS — L649 Androgenic alopecia, unspecified: Secondary | ICD-10-CM | POA: Diagnosis not present

## 2017-02-16 DIAGNOSIS — K648 Other hemorrhoids: Secondary | ICD-10-CM | POA: Diagnosis not present

## 2017-02-16 NOTE — Progress Notes (Signed)
Subjective:    Patient ID: [name] Mccarthy, female    DOB: Jan 10, 1976, 41 y.o.   MRN: [id number] Chief Complaint  Patient presents with  . Face    dry skin, "ongoing", pt states "she had bloodwork 6 wks ago, haven't gotten the results"  . leaking poop    per pt always the same comes and goes    HPI Face partially better.  Hair loss continues on significantly.  Taking mvi for women over 50,  Calcium/vit D, probiotics, and collagen.  Dr. Estanislado Pandyivard is doing hormone therapy.  "Is life-saving" as her 30-35 hots flashes and palpitats resolved, mood swings all improved. Sleep great now. She is noting that her hair loss is on the top of her head and her mother had very thinned hair.  Had hemorrhoids ever since her daughter and then several years later it started and has remained constant since. Feces is a tiny amount out of rectum.  It is periodic.  No painful. Usually has 1-2x/d BM w/o any c/d, no straining, no blood.  Has tried Kegel exercises for the rectum without any improvement.   Has lost almost 40lbs and so is on a very special diet.  She is fasting this week so not eating any foods from animals.  Can't exercise with knee - having surgery on 03/13/17  No past medical history on file. Past Surgical History:  Procedure Laterality Date  . CESAREAN SECTION    . KNEE SURGERY    . OTHER SURGICAL HISTORY     knee 2011   Current Outpatient Medications on File Prior to Visit  Medication Sig Dispense Refill  . ammonium lactate (AMLACTIN) 12 % cream Apply as needed topically for dry skin. 385 g 0  . Calcium Carb-Cholecalciferol (CALCIUM 500/D) 500-400 MG-UNIT CHEW Chew by mouth.    . estradiol (CLIMARA - DOSED IN MG/24 HR) 0.0375 mg/24hr patch Place 0.0375 mg onto the skin once a week.    . hydrocortisone valerate ointment (WEST-CORT) 0.2 % Apply 1 application daily topically. 60 g 1  . ibuprofen (ADVIL,MOTRIN) 100 MG tablet Take 100 mg by mouth every 6 (six) hours as needed.     No current  facility-administered medications on file prior to visit.    Allergies  Allergen Reactions  . Hydrocodone-Homatropine     Increase heart rate, severe vomiting, itchy skin.  Marland Kitchen. Hydrocodone-Homatropine     Syrup  . Oxycodone     Nausea, vomiting, increase heart rate.   Family History  Problem Relation Age of Onset  . Arthritis Mother    Social History   Socioeconomic History  . Marital status: Significant Other    Spouse name: n/a  . Number of children: 1  . Years of education: None  . Highest education level: None  Social Needs  . Financial resource strain: None  . Food insecurity - worry: None  . Food insecurity - inability: None  . Transportation needs - medical: None  . Transportation needs - non-medical: None  Occupational History  . Occupation: Paralegal  Tobacco Use  . Smoking status: Former Games developermoker  . Smokeless tobacco: Never Used  Substance and Sexual Activity  . Alcohol use: No  . Drug use: No  . Sexual activity: None  Other Topics Concern  . None  Social History Narrative   From [place].   [place]ame to the [place] in 2000   Lives with her boyfriend (since 2000) and daughter.   [facility] 02/16/2017 02/16/2017 10/21/2016 11/30/2014 11/28/2014  Decreased Interest 0 0 0 0 0  Down, Depressed, Hopeless 0 0 0 0 0  PHQ - 2 Score 0 0 0 0 0     Review of Systems See hpi    Objective:   Physical Exam  Constitutional: She is oriented to person, place, and time. She appears well-developed and well-nourished. No distress.  HENT:  Head: Normocephalic and atraumatic.  Right Ear: External ear normal.  Left Ear: External ear normal.  Eyes: Conjunctivae are normal. No scleral icterus.  Neck: Normal range of motion. Neck supple. No thyromegaly present.  Cardiovascular: Normal rate, regular rhythm, normal heart sounds and intact distal pulses.  Pulmonary/Chest: Effort normal and breath sounds normal. No respiratory distress.  Musculoskeletal: She exhibits no  edema.  Lymphadenopathy:    She has no cervical adenopathy.  Neurological: She is alert and oriented to person, place, and time.  Skin: Skin is warm and dry. She is not diaphoretic. No erythema.  Psychiatric: She has a normal mood and affect. Her behavior is normal.         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  If sxs do not improve after starting vit supp and second opinion to re-eval her current hormone therapy regimen, could try topical minoxidal 5% foam (1/2 capful) qd to scalp (not hair) - massage onto scalp with fingers then wash hands after application. Apply at least 2 hrs before bed to allow adequate drying time and avoid inadvertent spread to other body sites during sleep - needs minimum of 4 mos for visible effect and rec using for at least 12 mos before deciding there has been a trx failure. May have more hair shedding in first 2-8 wks of trx. The daily topical rx testosterone cream she is applying may be making this worse???   2. Hemorrhoids, internal  3. Dry skin dermatitis - slightly improved - cont amlactin. Likely hormonal.  (I can't really appreciate pt's concerns on exam - she endorses her face feeling rough).  4. Fecal smearing - start daily fiber supp for stool bulking.  Rec consider course of pelvic floor PT for anal sphincter strengthening. Consider having hemorrhoid removed/banded as likely exacerbating occurrence at minimum. Pt declines referral to PT or GI for these interventions currently but will call if she becomes ready to proceed.   Recommend she discuss concerns with a provider who has more experience with "homeopathic" and hormone treatments - pt interested in bioidentical hormones - try Dr. Jarold MottoPatterson  at [facility]. Pt has filled progesterone 200mg  #30 on 11/12, and progesterone micronized 100mg  po qd #90 on 02/02/17. As well as estradiol 0.05mg /24hr patch on 11/11 #12 and estradiol 0.1mg /day patch on 11/12 #12 - in addition to testosterone cream 0.1mg  topically daily.  Norberto SorensonEva Reba Hulett, M.D.  Primary [facility] at [facility]  [place] [address] [place], [place] [address] [phone] phone [phone] fax  02/22/17 6:58 PM

## 2017-02-16 NOTE — Patient Instructions (Addendum)
Start IRON SUPPLEMENT in your multivitamin and start BIOTIN supplement as well.   Start a daily fiber supplement to help bulk up your stools so that there is no liquid stool to leak out. Check out Dr. 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 Arnette Norrisheryl Grey. Consider having your internal hemorrhoid removed - this would like stop the stool leakage and would be done at a gastroenterologist or a general surgeons office.    IF you received an x-ray today, you will receive an invoice from [facility]. Please contact [facility] at [phone] with questions or concerns regarding your invoice.   IF you received labwork today, you will receive an invoice from [facility]. Please contact [facility] at [phone] with questions or concerns regarding your invoice.   Our billing staff will not be able to assist you with questions regarding bills from these companies.  You will be contacted with the lab results as soon as they are available. The fastest way to get your results is to activate your My Chart account. Instructions are located on the last page of this paperwork. If you have not heard from us regarding the results in 2 weeks, please contact this office.     Nonsurgical Procedures for Hemorrhoids Nonsurgical procedures can be used to treat hemorrhoids. Hemorrhoids are swollen veins that are inside the rectum (internal hemorrhoids) or around the anus (external hemorrhoids). They are caused by increased pressure in the anal area. This pressure may result from straining to have a bowel movement (constipation), diarrhea, pregnancy, obesity, anal sex, or sitting for long periods of time. Hemorrhoids can cause symptoms such as pain and bleeding. Various procedures may be performed if diet changes, lifestyle changes, and other treatments do not help  your symptoms. Some of these procedures do not involve surgery. Three common nonsurgical procedures are:  Rubber band ligation. Rubber bands are used to cut off the blood supply to the hemorrhoids.  Sclerotherapy. Medicine is injected into the hemorrhoids to shrink them.  Infrared coagulation. A type of light energy is used to get rid of the hemorrhoids.  Tell a health care provider about:  Any allergies you have.  All medicines you are taking, including vitamins, herbs, eye drops, creams, and over-the-counter medicines.  Any problems you or family members have had with anesthetic medicines.  Any blood disorders you have.  Any surgeries you have had.  Any medical conditions you have.  Whether you are pregnant or may be pregnant. What are the risks? Generally, this is a safe procedure. However, problems may occur, including:  Infection.  Bleeding.  Pain.  What happens before the procedure?  Ask your health care provider about: ? Changing or stopping your regular medicines. This is especially important if you are taking diabetes medicines or blood thinners. ? Taking medicines such as aspirin and ibuprofen. These medicines can thin your blood. Do not take these medicines before your procedure if your health care provider instructs you not to.  You may need to have a procedure to examine the inside of your colon with a scope (colonoscopy). Your health care provider may do this to make sure that there are no other causes for your bleeding or pain. What happens during the procedure?  Your health care provider will clean your rectal area with a rinsing solution.  A lubricating jelly may be placed into your rectum. The jelly  may contain a medicine to numb the area (local anesthetic).  Your health care provider will insert a short scope (anoscope) into your rectum to examine the hemorrhoids.  One of the following techniques will be used. Rubber Band Ligation Your health care  provider will place medical instruments through the scope to put rubber bands around the base of your hemorrhoids. The bands will cut off the blood supply to the hemorrhoids. The hemorrhoids will fall off after several days. Sclerotherapy Your health care provider will inject medicine through the scope into your hemorrhoids. This will cause them to shrink and dry up. Infrared Coagulation Your health care provider will shine a type of light through the scope onto your hemorrhoids. This light will generate energy (infrared radiation). It will cause the hemorrhoids to scar and then fall off. Each of these procedures may vary among health care providers and hospitals. What happens after the procedure?  You will be monitored to make sure that you have no bleeding.  Return to your normal activities as told by your health care provider. This information is not intended to replace advice given to you by your health care provider. Make sure you discuss any questions you have with your health care provider. Document Released: 12/15/2008 Document Revised: 07/26/2015 Document Reviewed: 05/15/2014 Elsevier Interactive Patient Education  2018 [employer]. About Hemorrhoids  Hemorrhoids are swollen veins in the lower rectum and anus.  Also called piles, hemorrhoids are a common problem.  Hemorrhoids may be internal (inside the rectum) or external (around the anus).  Internal Hemorrhoids  Internal hemorrhoids are often painless, but they rarely cause bleeding.  The internal veins may stretch and fall down (prolapse) through the anus to the outside of the body.  The veins may then become irritated and painful.  External Hemorrhoids  External hemorrhoids can be easily seen or felt around the anal opening.  They are under the skin around the anus.  When the swollen veins are scratched or broken by straining, rubbing or wiping they sometimes bleed.  How Hemorrhoids Occur  Veins in the rectum and around the  anus tend to swell under pressure.  Hemorrhoids can result from increased pressure in the veins of your anus or rectum.  Some sources of pressure are:   Straining to have a bowel movement because of constipation  Waiting too long to have a bowel movement  Coughing and sneezing often  Sitting for extended periods of time, including on the toilet  Diarrhea  Obesity  Trauma or injury to the anus  Some liver diseases  Stress  Family history of hemorrhoids  Pregnancy  Pregnant women should try to avoid becoming constipated, because they are more likely to have hemorrhoids during pregnancy.  In the last trimester of pregnancy, the enlarged uterus may press on blood vessels and causes hemorrhoids.  In addition, the strain of childbirth sometimes causes hemorrhoids after the birth.  Symptoms of Hemorrhoids  Some symptoms of hemorrhoids include:  Swelling and/or a tender lump around the anus  Itching, mild burning and bleeding around the anus  Painful bowel movements with or without constipation  Bright red blood covering the stool, on toilet paper or in the toilet bowel.   Symptoms usually go away within a few days.  Always talk to your doctor about any bleeding to make sure it is not from some other causes.  Diagnosing and Treating Hemorrhoids  Diagnosis is made by an examination by your healthcare provider.  Special test can be performed  by your doctor.    Most cases of hemorrhoids can be treated with:  High-fiber diet: Eat more high-fiber foods, which help prevent constipation.  Ask for more detailed fiber information on types and sources of fiber from your healthcare provider.  Fluids: Drink plenty of water.  This helps soften bowel movements so they are easier to pass.  Sitz baths and cold packs: Sitting in lukewarm water two or three times a day for 15 minutes cleases the anal area and may relieve discomfort.  If the water is too hot, swelling around the anus will get  worse.  Placing a cloth-covered ice pack on the anus for ten minutes four times a day can also help reduce selling.  Gently pushing a prolapsed hemorrhoid back inside after the bath or ice pack can be helpful.  Medications: For mild discomfort, your healthcare provider may suggest over-the-counter pain medication or prescribe a cream or ointment for topical use.  The cream may contain witch hazel, zinc oxide or petroleum jelly.  Medicated suppositories are also a treatment option.  Always consult your doctor before applying medications or creams.  Procedures and surgeries: There are also a number of procedures and surgeries to shrink or remove hemorrhoids in more serious cases.  Talk to your physician about these options.  You can often prevent hemorrhoids or keep them from becoming worse by maintaining a healthy lifestyle.  Eat a fiber-rich diet of fruits, vegetables and whole grains.  Also, drink plenty of water and exercise regularly.   2007, [employer] Doc.30

## 2017-03-03 DIAGNOSIS — M94261 Chondromalacia, right knee: Secondary | ICD-10-CM

## 2017-03-03 HISTORY — DX: Chondromalacia, right knee: M94.261

## 2017-03-04 ENCOUNTER — Other Ambulatory Visit: Payer: Self-pay | Admitting: Orthopaedic Surgery

## 2017-03-06 ENCOUNTER — Encounter (HOSPITAL_BASED_OUTPATIENT_CLINIC_OR_DEPARTMENT_OTHER): Payer: Self-pay | Admitting: *Deleted

## 2017-03-06 ENCOUNTER — Other Ambulatory Visit: Payer: Self-pay

## 2017-03-09 NOTE — H&P (Signed)
[name] Mccarthy is an 42 y.o. female.   Chief Complaint: Right knee pain HPI: Timber continues with terrible right knee pain.  She remains on 2 crutches.  We have been following this for several months.  She did undergo the MRI scan a while back which showed some significant edema in the medial tibial plateau.  She is here with her mother-in-law.  She wonders what can be done as she is not getting better.  It is starting to affect the opposite knee at this point.    MRI:  I reviewed an MRI scan films and report of a study done at the [facility] on 12/06/16.  This is notable for medial tibial plateau marrow edema with no meniscal tears and an intact ACL reconstruction.  Past Medical History:  Diagnosis Date  . Androgenic alopecia   . 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 Dental crowns present   . Family history of adverse reaction to anesthesia    pt's mother had problem with anesthesia 30 yrs. ago in [place] - specifics unknown by pt.    Past Surgical History:  Procedure Laterality Date  . CESAREAN SECTION  09/03/2002  . CHROMOPERTUBATION  08/20/2001  . DIAGNOSTIC LAPAROSCOPY  08/20/2001  . KNEE ARTHROSCOPY Bilateral 2011    Family History  Problem Relation Age of Onset  . Arthritis Mother   . Anesthesia problems Mother        details unknown by pt.   Social History:  reports that she quit smoking about 10 years ago. she has never used smokeless tobacco. She reports that she does not drink alcohol or use drugs.  Allergies:  Allergies  Allergen Reactions  . Hydrocodone Shortness Of Breath, Itching and Palpitations  . Oxycodone Shortness Of Breath, Itching and Palpitations    No medications prior to admission.    No results found for this or any previous visit (from the past 48 hour(s)). No results found.  Review of Systems  Musculoskeletal: Positive for joint pain.       Right knee  All other systems reviewed and are negative.   Height 5\' 1"  (1.549 m), weight 68 kg  (150 lb), last menstrual period 01/03/2017. Physical Exam  Constitutional: She is oriented to person, place, and time. She appears well-developed and well-nourished.  HENT:  Head: Normocephalic and atraumatic.  Eyes: Pupils are equal, round, and reactive to light.  Neck: Normal range of motion.  Cardiovascular: Normal rate and regular rhythm.  Respiratory: Effort normal.  GI: Soft.  Musculoskeletal:  Right knee has trace effusion.  She has some medial joint line pain but her greatest pain is through the proximal tibia.  Her motion is 0-130.  She has good tight ligamentous structures.  I do not feel any crepitation.  Hip motion is full and straight leg raise is negative.   Neurological: She is alert and oriented to person, place, and time.  Skin: Skin is warm and dry.  Psychiatric: She has a normal mood and affect. Her behavior is normal. Judgment and thought content normal.     Assessment/Plan Assessment: Right knee medial tibial stress reaction by MRI 2018 with ACL reconstruction 2011  Plan: Deanna continues to struggle with some terrible pain in her knee.  She has been on crutches for many months.  Based on her MRI scan I think what she needs is a sub-chondroplasty.  She has a great deal of marrow edema on the medial aspect of the tibia which is where she hurts.  I reviewed risk of anesthesia, infection, DVT related to a knee arthroscopy followed by sub-chondroplasty.    Westlee Devita, Ginger OrganNDREW PAUL, PA-C 03/09/2017, 2:41 PM

## 2017-03-13 ENCOUNTER — Ambulatory Visit (HOSPITAL_BASED_OUTPATIENT_CLINIC_OR_DEPARTMENT_OTHER): Payer: BLUE CROSS/BLUE SHIELD | Admitting: Anesthesiology

## 2017-03-13 ENCOUNTER — Encounter (HOSPITAL_BASED_OUTPATIENT_CLINIC_OR_DEPARTMENT_OTHER)
Admission: RE | Disposition: A | Discharge status: Home / Self Care | Payer: Self-pay | Source: Ambulatory Visit | Attending: Orthopaedic Surgery

## 2017-03-13 ENCOUNTER — Encounter (HOSPITAL_BASED_OUTPATIENT_CLINIC_OR_DEPARTMENT_OTHER): Payer: Self-pay | Admitting: *Deleted

## 2017-03-13 ENCOUNTER — Other Ambulatory Visit: Payer: Self-pay

## 2017-03-13 ENCOUNTER — Ambulatory Visit (HOSPITAL_BASED_OUTPATIENT_CLINIC_OR_DEPARTMENT_OTHER)
Admission: RE | Admit: 2017-03-13 | Discharge: 2017-03-13 | Disposition: A | Discharge status: Home / Self Care | Payer: BLUE CROSS/BLUE SHIELD | Source: Ambulatory Visit | Attending: Orthopaedic Surgery | Admitting: Orthopaedic Surgery

## 2017-03-13 DIAGNOSIS — M84361A Stress fracture, right tibia, initial encounter for fracture: Secondary | ICD-10-CM | POA: Diagnosis not present

## 2017-03-13 DIAGNOSIS — M94261 Chondromalacia, right knee: Secondary | ICD-10-CM | POA: Insufficient documentation

## 2017-03-13 DIAGNOSIS — Z87891 Personal history of nicotine dependence: Secondary | ICD-10-CM | POA: Diagnosis not present

## 2017-03-13 DIAGNOSIS — X58XXXA Exposure to other specified factors, initial encounter: Secondary | ICD-10-CM | POA: Diagnosis not present

## 2017-03-13 HISTORY — DX: Androgenic alopecia, unspecified: L64.9

## 2017-03-13 HISTORY — DX: Family history of other specified conditions: Z84.89

## 2017-03-13 HISTORY — DX: Dental restoration status: Z98.811

## 2017-03-13 HISTORY — PX: KNEE ARTHROSCOPY WITH SUBCHONDROPLASTY: SHX6732

## 2017-03-13 HISTORY — DX: Chondromalacia, right knee: M94.261

## 2017-03-13 SURGERY — ARTHROSCOPY, KNEE, WITH SUBCHONDROPLASTY
Anesthesia: General | Site: Knee | Laterality: Right

## 2017-03-13 MED ORDER — LACTATED RINGERS IV SOLN
INTRAVENOUS | Status: DC
  Administered 2017-03-13: 14:00:00 via INTRAVENOUS

## 2017-03-13 MED ORDER — CHLORHEXIDINE GLUCONATE 4 % EX LIQD: 60.0000 mL | Freq: Once | CUTANEOUS | Status: DC

## 2017-03-13 MED ORDER — METOCLOPRAMIDE HCL 5 MG/ML IJ SOLN: 10.0000 mg | Freq: Once | INTRAMUSCULAR | Status: DC | PRN

## 2017-03-13 MED ORDER — EPHEDRINE SULFATE 50 MG/ML IJ SOLN
INTRAMUSCULAR | Status: DC | PRN
  Administered 2017-03-13: 10 mg via INTRAVENOUS

## 2017-03-13 MED ORDER — CEFAZOLIN SODIUM-DEXTROSE 2-4 GM/100ML-% IV SOLN
INTRAVENOUS | Status: AC
Start: 2017-03-13 — End: ?
  Filled 2017-03-13: qty 100

## 2017-03-13 MED ORDER — FENTANYL CITRATE (PF) 100 MCG/2ML IJ SOLN
50.0000 ug | INTRAMUSCULAR | Status: AC | PRN
  Administered 2017-03-13: 50 ug via INTRAVENOUS
  Administered 2017-03-13: 100 ug via INTRAVENOUS
  Administered 2017-03-13: 50 ug via INTRAVENOUS

## 2017-03-13 MED ORDER — ONDANSETRON HCL 4 MG/2ML IJ SOLN
INTRAMUSCULAR | Status: AC
  Filled 2017-03-13: qty 2

## 2017-03-13 MED ORDER — SCOPOLAMINE 1 MG/3DAYS TD PT72: 1.0000 | MEDICATED_PATCH | Freq: Once | TRANSDERMAL | Status: DC | PRN

## 2017-03-13 MED ORDER — FENTANYL CITRATE (PF) 100 MCG/2ML IJ SOLN
INTRAMUSCULAR | Status: AC
  Filled 2017-03-13: qty 2

## 2017-03-13 MED ORDER — METHYLPREDNISOLONE ACETATE 40 MG/ML IJ SUSP
INTRAMUSCULAR | Status: AC
Start: 2017-03-13 — End: ?
  Filled 2017-03-13: qty 1

## 2017-03-13 MED ORDER — HYDROMORPHONE HCL 2 MG PO TABS: 2.0000 mg | ORAL_TABLET | ORAL | 0 refills | Status: AC | PRN

## 2017-03-13 MED ORDER — MORPHINE SULFATE (PF) 4 MG/ML IV SOLN
INTRAVENOUS | Status: AC
  Filled 2017-03-13: qty 1

## 2017-03-13 MED ORDER — PROPOFOL 10 MG/ML IV BOLUS
INTRAVENOUS | Status: DC | PRN
  Administered 2017-03-13: 200 mg via INTRAVENOUS

## 2017-03-13 MED ORDER — LACTATED RINGERS IV SOLN
INTRAVENOUS | Status: DC
  Administered 2017-03-13: 12:00:00 via INTRAVENOUS

## 2017-03-13 MED ORDER — ONDANSETRON HCL 4 MG/2ML IJ SOLN: 4.0000 mg | Freq: Once | INTRAMUSCULAR | Status: DC

## 2017-03-13 MED ORDER — CEFAZOLIN SODIUM-DEXTROSE 2-3 GM-%(50ML) IV SOLR
INTRAVENOUS | Status: DC | PRN
  Administered 2017-03-13: 2 g via INTRAVENOUS

## 2017-03-13 MED ORDER — STERILE WATER FOR INJECTION IJ SOLN
INTRAMUSCULAR | Status: AC
  Filled 2017-03-13: qty 10

## 2017-03-13 MED ORDER — DEXAMETHASONE SODIUM PHOSPHATE 10 MG/ML IJ SOLN
INTRAMUSCULAR | Status: DC | PRN
  Administered 2017-03-13: 10 mg via INTRAVENOUS

## 2017-03-13 MED ORDER — CEFAZOLIN SODIUM-DEXTROSE 2-4 GM/100ML-% IV SOLN: 2.0000 g | INTRAVENOUS | Status: DC

## 2017-03-13 MED ORDER — LACTATED RINGERS IV SOLN
INTRAVENOUS | Status: DC
Start: 2017-03-13 — End: 2017-03-13

## 2017-03-13 MED ORDER — MORPHINE SULFATE (PF) 4 MG/ML IV SOLN
INTRAVENOUS | Status: DC | PRN
  Administered 2017-03-13: 4 mg via INTRAVENOUS

## 2017-03-13 MED ORDER — SODIUM CHLORIDE 0.9 % IR SOLN
Status: DC | PRN
  Administered 2017-03-13: 6000 mL

## 2017-03-13 MED ORDER — MEPERIDINE HCL 25 MG/ML IJ SOLN: 6.2500 mg | INTRAMUSCULAR | Status: DC | PRN

## 2017-03-13 MED ORDER — MIDAZOLAM HCL 2 MG/2ML IJ SOLN
1.0000 mg | INTRAMUSCULAR | Status: DC | PRN
  Administered 2017-03-13: 2 mg via INTRAVENOUS

## 2017-03-13 MED ORDER — METHYLPREDNISOLONE ACETATE 80 MG/ML IJ SUSP
INTRAMUSCULAR | Status: AC
  Filled 2017-03-13: qty 1

## 2017-03-13 MED ORDER — BUPIVACAINE-EPINEPHRINE 0.5% -1:200000 IJ SOLN
INTRAMUSCULAR | Status: DC | PRN
  Administered 2017-03-13: 20 mL

## 2017-03-13 MED ORDER — MIDAZOLAM HCL 2 MG/2ML IJ SOLN
INTRAMUSCULAR | Status: AC
  Filled 2017-03-13: qty 2

## 2017-03-13 MED ORDER — PROPOFOL 10 MG/ML IV BOLUS
INTRAVENOUS | Status: AC
  Filled 2017-03-13: qty 40

## 2017-03-13 MED ORDER — FENTANYL CITRATE (PF) 100 MCG/2ML IJ SOLN
25.0000 ug | INTRAMUSCULAR | Status: DC | PRN
  Administered 2017-03-13 (×2): 50 ug via INTRAVENOUS

## 2017-03-13 MED ORDER — DEXAMETHASONE SODIUM PHOSPHATE 10 MG/ML IJ SOLN
INTRAMUSCULAR | Status: AC
  Filled 2017-03-13: qty 1

## 2017-03-13 MED ORDER — LIDOCAINE 2% (20 MG/ML) 5 ML SYRINGE
INTRAMUSCULAR | Status: AC
  Filled 2017-03-13: qty 5

## 2017-03-13 MED ORDER — LIDOCAINE HCL (CARDIAC) 20 MG/ML IV SOLN
INTRAVENOUS | Status: DC | PRN
  Administered 2017-03-13: 80 mg via INTRAVENOUS

## 2017-03-13 SURGICAL SUPPLY — 38 items
BANDAGE ACE 6X5 VEL STRL LF (GAUZE/BANDAGES/DRESSINGS) ×2 IMPLANT
BLADE CUDA 5.5 (BLADE) IMPLANT
BLADE GREAT WHITE 4.2 (BLADE) ×2 IMPLANT
BNDG GAUZE ELAST 4 BULKY (GAUZE/BANDAGES/DRESSINGS) ×2 IMPLANT
DRAPE ARTHROSCOPY W/POUCH 90 (DRAPES) ×2 IMPLANT
DRAPE OEC MINIVIEW 54X84 (DRAPES) ×2 IMPLANT
DRAPE U-SHAPE 47X51 STRL (DRAPES) ×2 IMPLANT
DRSG EMULSION OIL 3X3 NADH (GAUZE/BANDAGES/DRESSINGS) ×2 IMPLANT
DURAPREP 26ML APPLICATOR (WOUND CARE) ×2 IMPLANT
ELECT MENISCUS 165MM 90D (ELECTRODE) IMPLANT
ELECT REM PT RETURN 9FT ADLT (ELECTROSURGICAL)
ELECTRODE REM PT RTRN 9FT ADLT (ELECTROSURGICAL) IMPLANT
GAUZE SPONGE 4X4 12PLY STRL (GAUZE/BANDAGES/DRESSINGS) ×2 IMPLANT
GLOVE BIO SURGEON STRL SZ 6.5 (GLOVE) ×1 IMPLANT
GLOVE BIO SURGEON STRL SZ8 (GLOVE) ×4 IMPLANT
GLOVE BIOGEL PI IND STRL 7.0 (GLOVE) IMPLANT
GLOVE BIOGEL PI IND STRL 8 (GLOVE) ×2 IMPLANT
GLOVE BIOGEL PI INDICATOR 7.0 (GLOVE) ×1
GLOVE BIOGEL PI INDICATOR 8 (GLOVE) ×3
GOWN STRL REUS W/ TWL LRG LVL3 (GOWN DISPOSABLE) ×1 IMPLANT
GOWN STRL REUS W/ TWL XL LVL3 (GOWN DISPOSABLE) ×2 IMPLANT
GOWN STRL REUS W/TWL LRG LVL3 (GOWN DISPOSABLE) ×2
GOWN STRL REUS W/TWL XL LVL3 (GOWN DISPOSABLE) ×4
IV NS IRRIG 3000ML ARTHROMATIC (IV SOLUTION) IMPLANT
KIT MIXER ACCUMIX (KITS) ×1 IMPLANT
KNEE KIT SCP W/SIDE ACCUPORT (Joint) ×1 IMPLANT
KNEE WRAP E Z 3 GEL PACK (MISCELLANEOUS) ×2 IMPLANT
MANIFOLD NEPTUNE II (INSTRUMENTS) ×1 IMPLANT
PACK ARTHROSCOPY DSU (CUSTOM PROCEDURE TRAY) ×2 IMPLANT
PACK BASIN DAY SURGERY FS (CUSTOM PROCEDURE TRAY) ×2 IMPLANT
PENCIL BUTTON HOLSTER BLD 10FT (ELECTRODE) IMPLANT
PROBE BIPOLAR ATHRO 135MM 90D (MISCELLANEOUS) IMPLANT
SET ARTHROSCOPY TUBING (MISCELLANEOUS) ×2
SET ARTHROSCOPY TUBING LN (MISCELLANEOUS) ×1 IMPLANT
SHEET MEDIUM DRAPE 40X70 STRL (DRAPES) ×2 IMPLANT
SYR 3ML 18GX1 1/2 (SYRINGE) IMPLANT
TOWEL OR 17X24 6PK STRL BLUE (TOWEL DISPOSABLE) ×2 IMPLANT
TOWEL OR NON WOVEN STRL DISP B (DISPOSABLE) ×2 IMPLANT

## 2017-03-13 NOTE — Discharge Instructions (Signed)

## 2017-03-13 NOTE — Anesthesia Preprocedure Evaluation (Signed)
Anesthesia Evaluation  Patient identified by MRN, date of birth, ID band Patient awake    Reviewed: Allergy & Precautions, NPO status , Patient's Chart, lab work & pertinent test results  Airway Mallampati: II  TM Distance: >3 FB Neck ROM: Full    Dental no notable dental hx. (+) Caps   Pulmonary neg pulmonary ROS, former smoker,    Pulmonary exam normal breath sounds clear to auscultation       Cardiovascular negative cardio ROS Normal cardiovascular exam Rhythm:Regular Rate:Normal     Neuro/Psych negative neurological ROS  negative psych ROS   GI/Hepatic negative GI ROS, Neg liver ROS,   Endo/Other  negative endocrine ROS  Renal/GU negative Renal ROS  negative genitourinary   Musculoskeletal negative musculoskeletal ROS (+)   Abdominal   Peds negative pediatric ROS (+)  Hematology negative hematology ROS (+)   Anesthesia Other Findings   Reproductive/Obstetrics negative OB ROS                             Anesthesia Physical Anesthesia Plan  ASA: II  Anesthesia Plan: General   Post-op Pain Management:    Induction: Intravenous  PONV Risk Score and Plan: 3 and Ondansetron, Dexamethasone and Midazolam  Airway Management Planned: LMA  Additional Equipment:   Intra-op Plan:   Post-operative Plan: Extubation in OR  Informed Consent: I have reviewed the patients History and Physical, chart, labs and discussed the procedure including the risks, benefits and alternatives for the proposed anesthesia with the patient or authorized representative who has indicated his/her understanding and acceptance.   Dental advisory given  Plan Discussed with: CRNA  Anesthesia Plan Comments:         Anesthesia Quick Evaluation

## 2017-03-13 NOTE — Anesthesia Procedure Notes (Signed)
Procedure Name: LMA Insertion Performed by: Karen KitchensKelly, Shyler Hamill M, CRNA Pre-anesthesia Checklist: Patient identified, Emergency Drugs available, Suction available and Timeout performed Preoxygenation: Pre-oxygenation with 100% oxygen Induction Type: IV induction LMA: LMA inserted LMA Size: 4.0 Tube type: Oral Placement Confirmation: positive ETCO2,  CO2 detector and breath sounds checked- equal and bilateral Dental Injury: Teeth and Oropharynx as per pre-operative assessment

## 2017-03-13 NOTE — Interval H&P Note (Signed)
History and Physical Interval Note:  03/13/2017 12:24 PM  [name] Mccarthy  has presented today for surgery, with the diagnosis of RIGHT KNEE CHONDROMALACIA AND STRESS INJURY  The various methods of treatment have been discussed with the patient and family. After consideration of risks, benefits and other options for treatment, the patient has consented to  Procedure(s): KNEE ARTHROSCOPY WITH SUBCHONDROPLASTY (Right) as a surgical intervention .  The patient's history has been reviewed, patient examined, no change in status, stable for surgery.  I have reviewed the patient's chart and labs.  Questions were answered to the patient's satisfaction.     Keefer Soulliere G

## 2017-03-13 NOTE — Transfer of Care (Signed)
Immediate Anesthesia Transfer of Care Note  Patient: [name] Mccarthy  Procedure(s) Performed: KNEE ARTHROSCOPY WITH SUBCHONDROPLASTY (Right Knee)  Patient Location: PACU  Anesthesia Type:General  Level of Consciousness: awake, alert  and oriented  Airway & Oxygen Therapy: Patient Spontanous Breathing and Patient connected to face mask oxygen  Post-op Assessment: Report given to RN and Post -op Vital signs reviewed and stable  Post vital signs: Reviewed and stable  Last Vitals:  Vitals:   03/13/17 1131  BP: 115/73  Pulse: 74  Resp: 18  Temp: 36.7 C  SpO2: 100%    Last Pain:  Vitals:   03/13/17 1131  TempSrc: Oral  PainSc: 6       Patients Stated Pain Goal: 3 (03/13/17 1131)  Complications: No apparent anesthesia complications

## 2017-03-13 NOTE — Op Note (Signed)
#  [id number] 

## 2017-03-16 ENCOUNTER — Encounter (HOSPITAL_BASED_OUTPATIENT_CLINIC_OR_DEPARTMENT_OTHER): Payer: Self-pay | Admitting: Orthopaedic Surgery

## 2017-03-16 NOTE — Anesthesia Postprocedure Evaluation (Signed)
Anesthesia Post Note  Patient: [name] Mccarthy  Procedure(s) Performed: KNEE ARTHROSCOPY WITH SUBCHONDROPLASTY (Right Knee)     Patient location during evaluation: PACU Anesthesia Type: General Level of consciousness: awake and alert Pain management: pain level controlled Vital Signs Assessment: post-procedure vital signs reviewed and stable Respiratory status: spontaneous breathing, nonlabored ventilation, respiratory function stable and patient connected to nasal cannula oxygen Cardiovascular status: blood pressure returned to baseline and stable Postop Assessment: no apparent nausea or vomiting Anesthetic complications: no    Last Vitals:  Vitals:   03/13/17 1515 03/13/17 1555  BP: (!) 99/56 (!) 108/51  Pulse: 76 68  Resp: 10 18  Temp:  36.6 C  SpO2: 95% 100%    Last Pain:  Vitals:   03/13/17 1555  TempSrc:   PainSc: 5                  Phillips Groutarignan, Anamari Galeas

## 2017-03-16 NOTE — Op Note (Signed)
NAMForrestine Him:  Mccarthy, [name]              ACCOUNT NO.:  [id number]  MEDICAL RECORD NO.:  [id number]  LOCATION:                                 FACILITY:  PHYSICIAN:  Lubertha Basqueeter G. Jerl Santosalldorf, M.D.     DATE OF BIRTH:  DATE OF PROCEDURE:  03/13/2017 DATE OF DISCHARGE:                              OPERATIVE REPORT   PREOPERATIVE DIAGNOSES: 1. Right knee chondromalacia. 2. Right knee nondisplaced fracture, medial tibial plateau.  POSTOPERATIVE DIAGNOSES: 1. Right knee chondromalacia. 2. Right knee nondisplaced fracture, medial tibial plateau.  PROCEDURES: 1. Right knee chondroplasty. 2. Right knee percutaneous arthroscopic-assisted fixation of medial     tibial plateau fracture.  ANESTHESIA:  General.  ATTENDING SURGEON:  Lubertha BasquePeter G. Jerl Santosalldorf, MD.  ASSISTANElodia Florence:  Andrew Nida, PA.  INDICATION FOR PROCEDURE:  The patient is a 42 year old woman with perhaps 6 months of right knee pain.  This has persisted despite medications and therapy and the use of crutches.  She underwent an MRI scan, which was significant with medial tibial plateau marrow edema, but no evidence of meniscal injury.  We had reconstructed her ACL back in 2011 and that was intact.  At this point, she is offered arthroscopy with a subchondroplasty for fixation of her medial tibial plateau fracture.  Informed operative consent was obtained after discussion of possible complications including reaction to anesthesia and infection.  SUMMARY OF FINDINGS AND PROCEDURE:  Under general anesthesia, a right knee arthroscopy was performed.  Suprapatellar pouch was benign while the patellofemoral joint exhibited some grade 3 change in the intertrochlear groove.  A thorough chondroplasty was done.  There was no exposed bone.  Medial and lateral compartments showed no evidence of meniscal articular cartilage injury.  The reconstructed ACL appeared normal.  We then performed the subchondroplasty arthroscopic-assisted fixation of the medial  tibial plateau fracture.  We placed calcium phosphate into the weakened area and did this under fluoroscopic guidance.  She was closed primarily and discharged home.  DESCRIPTION OF PROCEDURE:  The patient was taken to the operating suite where general anesthetic was applied without difficulty.  She was positioned supine and prepped and draped in a normal sterile fashion. After the administration of preop IV Kefzol and an appropriate time-out, an arthroscopy of the right knee was performed through a total of 2 portals.  Findings were as noted above and procedure consisted of chondroplasty through the intertrochlear groove.  We then made a small stab wound on the medial aspect of the tibial plateau.  I utilized preoperative MRI to localize the area of interest.  We advanced the guide pin into the fracture site under fluoroscopic guidance in 2 planes.  We then implanted 5 mL of calcium phosphate implant through 5 syringes.  I allowed this to sit for 10 minutes and then removed the trocar needle.  I then placed the scope once again to confirm no extravasation of material into the knee.  The knee was thoroughly irrigated followed by placement of Adaptic over the portals.  Dry gauze was applied followed by loose Ace wrap.  estimated blood loss and intraoperative fluids can be obtained from anesthesia records as can accurate tourniquet time.  DISPOSITION:  The patient  was extubated in the operating room and taken to the recovery room in stable condition.  She was to go home same-day and follow up in the office closely.  I will contact her by phone tonight.     Lubertha Basque Jerl Santos, M.D.     PGD/MEDQ  D:  03/13/2017  T:  03/14/2017  Job:  [id number]

## 2017-04-24 ENCOUNTER — Other Ambulatory Visit: Payer: Self-pay | Admitting: Orthopaedic Surgery

## 2017-04-24 DIAGNOSIS — R5381 Other malaise: Secondary | ICD-10-CM

## 2017-04-30 ENCOUNTER — Other Ambulatory Visit: Payer: Self-pay | Admitting: Orthopaedic Surgery

## 2017-04-30 DIAGNOSIS — M8580 Other specified disorders of bone density and structure, unspecified site: Secondary | ICD-10-CM

## 2017-05-13 ENCOUNTER — Inpatient Hospital Stay
Admission: RE | Admit: 2017-05-13 | Discharge: 2017-05-13 | Disposition: A | Discharge status: Home / Self Care | Payer: BLUE CROSS/BLUE SHIELD | Source: Ambulatory Visit | Attending: Orthopaedic Surgery | Admitting: Orthopaedic Surgery

## 2017-05-18 ENCOUNTER — Ambulatory Visit
Admission: RE | Admit: 2017-05-18 | Discharge: 2017-05-18 | Disposition: A | Discharge status: Home / Self Care | Payer: BLUE CROSS/BLUE SHIELD | Source: Ambulatory Visit | Attending: Orthopaedic Surgery | Admitting: Orthopaedic Surgery

## 2017-05-18 DIAGNOSIS — M8580 Other specified disorders of bone density and structure, unspecified site: Secondary | ICD-10-CM

## 2017-07-24 ENCOUNTER — Telehealth: Payer: Self-pay | Admitting: Family Medicine

## 2017-07-24 DIAGNOSIS — K649 Unspecified hemorrhoids: Secondary | ICD-10-CM

## 2017-07-24 NOTE — Telephone Encounter (Signed)
Copied from CRM #[id number]. Topic: Referral - Request °>> Jul 24, 2017 11:37 AM Harrald, Kathy J wrote: °Reason for CRM: pt states she still has ongoing hemorrhoid problem and was hoping to get referral from Dr Shaw to see someone for this issue. °Pt has made an appt on June 26, first available after she gets off work ° °

## 2017-07-24 NOTE — Telephone Encounter (Signed)
Copied from CRM [id number]. Topic: Referral - Request >> Jul 24, 2017 11:37 AM Crist Infante wrote: Reason for CRM: pt states she still has ongoing hemorrhoid problem and was hoping to get referral from Dr Clelia Croft to see someone for this issue. Pt has made an appt on June 26, first available after she gets off work

## 2017-07-29 ENCOUNTER — Encounter: Payer: Self-pay | Admitting: Family Medicine

## 2017-07-29 DIAGNOSIS — N809 Endometriosis, unspecified: Secondary | ICD-10-CM | POA: Insufficient documentation

## 2017-07-29 NOTE — Telephone Encounter (Signed)
Please let pt know I put in a referral - I typically refer to [facility] but they schedule several months out while I have had pts be able to see a dr at [facility] pretty quickly so asked for referral to be sent over to [facility].  If she is having acute issues and needs to be seen sooner at our office to start on initial treatment or even thrombectomy, she is welcome to make a "same day" or "acute" appointment with anyone of my colleagues if I am not avail - we try to keep sev appts open after 5 every day for such issues.

## 2017-08-26 ENCOUNTER — Ambulatory Visit: Payer: BLUE CROSS/BLUE SHIELD | Admitting: Family Medicine

## 2017-08-26 ENCOUNTER — Encounter: Payer: Self-pay | Admitting: Family Medicine

## 2017-08-26 ENCOUNTER — Other Ambulatory Visit: Payer: Self-pay

## 2017-08-26 DIAGNOSIS — L309 Dermatitis, unspecified: Secondary | ICD-10-CM

## 2017-08-26 DIAGNOSIS — R151 Fecal smearing: Secondary | ICD-10-CM

## 2017-08-26 DIAGNOSIS — K640 First degree hemorrhoids: Secondary | ICD-10-CM | POA: Diagnosis not present

## 2017-08-26 MED ORDER — HYDROCORTISONE VALERATE 0.2 % EX CREA: 1.0000 "application " | TOPICAL_CREAM | Freq: Two times a day (BID) | CUTANEOUS | 1 refills | Status: DC

## 2017-08-26 MED ORDER — HYDROCORTISONE ACETATE 25 MG RE SUPP: RECTAL | 3 refills | Status: DC

## 2017-08-26 MED ORDER — AMMONIUM LACTATE 12 % EX CREA
TOPICAL_CREAM | CUTANEOUS | 1 refills | Status: DC | PRN
Start: 2017-08-26 — End: 2019-12-14

## 2017-08-26 MED ORDER — TRIAMCINOLONE ACETONIDE 0.1 % EX CREA: 1.0000 "application " | TOPICAL_CREAM | Freq: Two times a day (BID) | CUTANEOUS | 0 refills | Status: DC

## 2017-08-26 MED ORDER — UREA 40 % EX CREA: 1.0000 "application " | TOPICAL_CREAM | Freq: Three times a day (TID) | CUTANEOUS | 2 refills | Status: DC

## 2017-08-26 NOTE — Progress Notes (Addendum)
Subjective:  By signing my name below, I, Stann Ore, attest that this documentation has been prepared under the direction and in the presence of Norberto Sorenson, MD. Electronically Signed: Stann Ore, Scribe. 08/26/2017 , 6:58 PM .  Patient was seen in Room 1 .   Patient ID: [name] Mccarthy, female    DOB: 1975/05/29, 42 y.o.   MRN: [id number] Chief Complaint  Patient presents with  . Eczema    on face need more help    HPI [name] Mccarthy is a 42 y.o. female who presents to Primary Care at [facility] complaining of dry skin on her face ongoing for a long time. She did not see dermatologist for this. She's tried cortisone and ammonia lactate; continuing to use the cream, but face still feels dry and tight. She notes her skin has had some improvement, as it used to be more flaky. She denies any scaling around her eyebrows, or redness. She denies knowledge if she's used urea in the past. She denies any history of facial breakout. She notes drinking plenty of water. She denies any changes with her hair.   Her OBGYN, Dr. Estanislado Pandy, informed patient that her estrogen and progesterone were low, recommended cream. She's currently using estradiol patches applied twice a week. She's also taking DHEA now.   She mentioned at her last visit that she's had hemorrhoids ever since having her daughter 15 years ago. It has remained constant since. She was seen at [facility] and recommended surgery. She was informed to take fiber and continue to apply hydrocortisone cream.   Past Medical History:  Diagnosis Date  . Androgenic alopecia   . 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 Dental crowns present   . Family history of adverse reaction to anesthesia    pt's mother had problem with anesthesia 30 yrs. ago in [place] - specifics unknown by pt.  . 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 MRI right x 2, left x1.  RIGHT ACL repair (Dr. Margreta Journey) 2011.  MRI  after repair: 03/2009:1. ACL repair noted with intact graft. 2. 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 3. Trace knee effusion. 4. Low-level subcutaneous edema anterior to the patellar tendon and tibial tubercl   Past Surgical History:  Procedure Laterality Date  . CESAREAN SECTION  09/03/2002  . CHROMOPERTUBATION  08/20/2001  . DIAGNOSTIC LAPAROSCOPY  08/20/2001  . KNEE ARTHROSCOPY Bilateral 2011   Rt ACL repair by Dr. Fara Chute  . KNEE ARTHROSCOPY WITH SUBCHONDROPLASTY Right 03/13/2017   Procedure: KNEE ARTHROSCOPY WITH SUBCHONDROPLASTY;  Surgeon: Marcene Corning, MD;  Location: [facility];  Service: Orthopedics;  Laterality: Right;   Prior to Admission medications   Medication Sig Start Date End Date Taking? Authorizing Provider  Biotin 1000 MCG tablet Take 1,000 mcg by mouth 3 (three) times daily.    [provider]  Calcium Carb-Cholecalciferol (CALCIUM-VITAMIN D) 500-200 MG-UNIT tablet Take 1 tablet by mouth daily.    [provider]  estradiol (CLIMARA - DOSED IN MG/24 HR) 0.1 mg/24hr patch estradiol 0.1 mg/24 hr weekly transdermal patch  Apply 1 patch every week by transdermal route.    [provider]  HYDROmorphone (DILAUDID) 2 MG tablet Take 1-2 tablets (2-4 mg total) by mouth every 4 (four) hours as needed for severe pain. 03/13/17 03/13/18  Elodia Florence, PA-C  ibuprofen (ADVIL,MOTRIN) 800 MG tablet Take 800 mg by mouth every 8 (  eight) hours as needed.    [provider]  Multiple Vitamin (MULTIVITAMIN) tablet Take 1 tablet by mouth daily.    [provider]  Probiotic Product (PROBIOTIC DAILY PO) Take by mouth.    [provider]   Allergies  Allergen Reactions  . Hydrocodone Shortness Of Breath, Itching and Palpitations  . Oxycodone Shortness Of Breath, Itching and Palpitations   Family History  Problem Relation Age of Onset  . Arthritis Mother        osteo  .  Anesthesia problems Mother        details unknown by pt.  . Hypertension Mother   . Hypertension Father    Social History   Socioeconomic History  . Marital status: Significant Other    Spouse name: n/a  . Number of children: 1  . Years of education: Not on file  . Highest education level: Not on file  Occupational History  . Occupation: IT consultantaralegal  Social Needs  . Financial resource strain: Not on file  . Food insecurity:    Worry: Not on file    Inability: Not on file  . Transportation needs:    Medical: Not on file    Non-medical: Not on file  Tobacco Use  . Smoking status: Former Smoker    Last attempt to quit: 03/03/2007    Years since quitting: 10.4  . Smokeless tobacco: Never Used  Substance and Sexual Activity  . Alcohol use: No  . Drug use: No  . Sexual activity: Not on file  Lifestyle  . Physical activity:    Days per week: Not on file    Minutes per session: Not on file  . Stress: Not on file  Relationships  . Social connections:    Talks on phone: Not on file    Gets together: Not on file    Attends religious service: Not on file    Active member of club or organization: Not on file    Attends meetings of clubs or organizations: Not on file    Relationship status: Not on file  Other Topics Concern  . Not on file  Social History Narrative   From [place].   Came to [place] in 2000   Lives with her boyfriend (since 2000) and daughter.   [facility] 08/26/2017 02/16/2017 02/16/2017 10/21/2016 11/30/2014  Decreased Interest 0 0 0 0 0  Down, Depressed, Hopeless 0 0 0 0 0  PHQ - 2 Score 0 0 0 0 0    Review of Systems  Constitutional: Negative for chills, fatigue, fever and unexpected weight change.  Respiratory: Negative for cough.   Gastrointestinal: Negative for constipation, diarrhea, nausea and vomiting.  Skin: Negative for rash and wound.       Dry facial skin  Neurological: Negative for dizziness, weakness and headaches.         Objective:   Physical Exam  Constitutional: She is oriented to person, place, and time. She appears well-developed and well-nourished. No distress.  HENT:  Head: Normocephalic and atraumatic.  Eyes: Pupils are equal, round, and reactive to light. EOM are normal.  Neck: Neck supple.  Cardiovascular: Normal rate.  Pulmonary/Chest: Effort normal. No respiratory distress.  Musculoskeletal: Normal range of motion.  Neurological: She is alert and oriented to person, place, and time.  Skin: Skin is warm and dry.  Psychiatric: She has a normal mood and affect. Her behavior is normal.  Nursing note and vitals reviewed.     Assessment &  Plan:   1. Facial dermatitis - pt distress over dryness and need to keep reapplying ammonium lactate 12% cream or westcort.  I can't appreciate any sig abnml on exam so not sure of longer lasting rehydrating products - refer to derm for further eval. Try topical urea based cream in interim  2. Grade I hemorrhoids -had good response to anusol but recurs when she stops use. pt really wanting more definitive treatment as causing significant hygenic problems. Doesn't understand why prior GI/surg c/s won't just do procedure. Rec eval with GI Dr. Teressa Lower at [place] for further eval and review of r/b of all trx options.  3. Fecal smearing     Orders Placed This Encounter  Procedures  . Ambulatory referral to Dermatology    Referral Priority:   Routine    Referral Type:   Consultation    Referral Reason:   Specialty Services Required    Requested Specialty:   Dermatology    Number of Visits Requested:   1  . Ambulatory referral to Gastroenterology    Referral Priority:   Routine    Referral Type:   Consultation    Referral Reason:   Specialty Services Required    Number of Visits Requested:   1    Meds ordered this encounter  Medications  . urea (CARMOL) 40 % CREA    Sig: Apply 1 application topically 3 (three) times daily.    Dispense:  85 each    Refill:   2  . triamcinolone cream (KENALOG) 0.1 %    Sig: Apply 1 application topically 2 (two) times daily. Mix to 1:1 ratio with Eucerin years    Dispense:  80 g    Refill:  0  . hydrocortisone (ANUSOL-HC) 25 MG suppository    Sig: INSERT 1 SUPPOSITORY ONCE A DAY RECTALLY    Dispense:  12 suppository    Refill:  3  . ammonium lactate (AMLACTIN) 12 % cream    Sig: Apply topically as needed for dry skin.    Dispense:  385 g    Refill:  1  . hydrocortisone valerate cream (WESTCORT) 0.2 %    Sig: Apply 1 application topically 2 (two) times daily.    Dispense:  60 g    Refill:  1    I personally performed the services described in this documentation, which was scribed in my presence. The recorded information has been reviewed and considered, and addended by me as needed.   Norberto Sorenson, M.D.  Primary Care at [facility] [address] [place], [place] [address] [phone] phone [phone] fax  09/14/17 12:48 PM

## 2017-08-26 NOTE — Patient Instructions (Addendum)
   IF you received an x-ray today, you will receive an invoice from [facility]. Please contact  Radiology at [phone] with questions or concerns regarding your invoice.   IF you received labwork today, you will receive an invoice from [facility]. Please contact [facility] at [phone] with questions or concerns regarding your invoice.   Our billing staff will not be able to assist you with questions regarding bills from these companies.  You will be contacted with the lab results as soon as they are available. The fastest way to get your results is to activate your My Chart account. Instructions are located on the last page of this paperwork. If you have not heard from us regarding the results in 2 weeks, please contact this office.      Hemorrhoids Hemorrhoids are swollen veins in and around the rectum or anus. There are two types of hemorrhoids:  Internal hemorrhoids. These occur in the veins that are just inside the rectum. They may poke through to the outside and become irritated and painful.  External hemorrhoids. These occur in the veins that are outside of the anus and can be felt as a painful swelling or hard lump near the anus.  Most hemorrhoids do not cause serious problems, and they can be managed with home treatments such as diet and lifestyle changes. If home treatments do not help your symptoms, procedures can be done to shrink or remove the hemorrhoids. What are the causes? This condition is caused by increased pressure in the anal area. This pressure may result from various things, including:  Constipation.  Straining to have a bowel movement.  Diarrhea.  Pregnancy.  Obesity.  Sitting for long periods of time.  Heavy lifting or other activity that causes you to strain.  Anal sex.  What are the signs or symptoms? Symptoms of this condition include:  Pain.  Anal itching or irritation.  Rectal bleeding.  Leakage of stool  (feces).  Anal swelling.  One or more lumps around the anus.  How is this diagnosed? This condition can often be diagnosed through a visual exam. Other exams or tests may also be done, such as:  Examination of the rectal area with a gloved hand (digital rectal exam).  Examination of the anal canal using a small tube (anoscope).  A blood test, if you have lost a significant amount of blood.  A test to look inside the colon (sigmoidoscopy or colonoscopy).  How is this treated? This condition can usually be treated at home. However, various procedures may be done if dietary changes, lifestyle changes, and other home treatments do not help your symptoms. These procedures can help make the hemorrhoids smaller or remove them completely. Some of these procedures involve surgery, and others do not. Common procedures include:  Rubber band ligation. Rubber bands are placed at the base of the hemorrhoids to cut off the blood supply to them.  Sclerotherapy. Medicine is injected into the hemorrhoids to shrink them.  Infrared coagulation. A type of light energy is used to get rid of the hemorrhoids.  Hemorrhoidectomy surgery. The hemorrhoids are surgically removed, and the veins that supply them are tied off.  Stapled hemorrhoidopexy surgery. A circular stapling device is used to remove the hemorrhoids and use staples to cut off the blood supply to them.  Follow these instructions at home: Eating and drinking  Eat foods that have a lot of fiber in them, such as whole grains, beans, nuts, fruits, and vegetables. Ask your health   care provider about taking products that have added fiber (fiber supplements).  Drink enough fluid to keep your urine clear or pale yellow. Managing pain and swelling  Take warm sitz baths for 20 minutes, 3-4 times a day to ease pain and discomfort.  If directed, apply ice to the affected area. Using ice packs between sitz baths may be helpful. ? Put ice in a plastic  bag. ? Place a towel between your skin and the bag. ? Leave the ice on for 20 minutes, 2-3 times a day. General instructions  Take over-the-counter and prescription medicines only as told by your health care provider.  Use medicated creams or suppositories as told.  Exercise regularly.  Go to the bathroom when you have the urge to have a bowel movement. Do not wait.  Avoid straining to have bowel movements.  Keep the anal area dry and clean. Use wet toilet paper or moist towelettes after a bowel movement.  Do not sit on the toilet for long periods of time. This increases blood pooling and pain. Contact a health care provider if:  You have increasing pain and swelling that are not controlled by treatment or medicine.  You have uncontrolled bleeding.  You have difficulty having a bowel movement, or you are unable to have a bowel movement.  You have pain or inflammation outside the area of the hemorrhoids. This information is not intended to replace advice given to you by your health care provider. Make sure you discuss any questions you have with your health care provider. Document Released: 02/15/2000 Document Revised: 07/18/2015 Document Reviewed: 11/01/2014 Elsevier Interactive Patient Education  2018 Elsevier Inc.  

## 2017-09-04 ENCOUNTER — Other Ambulatory Visit: Payer: Self-pay | Admitting: Family Medicine

## 2017-09-06 NOTE — Telephone Encounter (Signed)
Med refill req sent to Dr. Clelia CroftShaw - req alternative to carmol-ins will not cover

## 2017-09-08 NOTE — Telephone Encounter (Signed)
Please inform pt that her insurance company is dictating therapy options.  She will need to discuss with them directly about either approving the medication or having them provide a list of proposed alternative therapies that they will cover. (I also advise that she request that an estimate of her out-of-pocket cost for each of the covered alternative treatments be included - can be a powerful tiebreaker between equally efficacious medications).   Alternately, I have referred pt to dermatology so can just wait to see what they recommend.

## 2017-09-14 ENCOUNTER — Telehealth: Payer: Self-pay | Admitting: Family Medicine

## 2017-09-14 NOTE — Telephone Encounter (Signed)
Copied from CRM [id number]. Topic: Referral - Status >> Sep 11, 2017  3:47 PM Oneal GroutSebastian, Jennifer S wrote: Reason for CRM: [facility] Dr Cloretta NedShahid  -----------------------------------------------------------------------------------------  Can we get OV notes completed to send pt's referral? Thank you!

## 2017-09-14 NOTE — Telephone Encounter (Signed)
Referral sent 7/15

## 2017-09-14 NOTE — Telephone Encounter (Signed)
Done. thanks

## 2017-11-09 ENCOUNTER — Ambulatory Visit (INDEPENDENT_AMBULATORY_CARE_PROVIDER_SITE_OTHER): Payer: BLUE CROSS/BLUE SHIELD | Admitting: Emergency Medicine

## 2017-11-09 ENCOUNTER — Encounter: Payer: Self-pay | Admitting: Emergency Medicine

## 2017-11-09 ENCOUNTER — Other Ambulatory Visit: Payer: Self-pay

## 2017-11-09 VITALS — BP 110/71 | HR 63 | Temp 99.3°F | Resp 16 | Ht 61.25 in | Wt 151.2 lb

## 2017-11-09 DIAGNOSIS — R51 Headache: Secondary | ICD-10-CM

## 2017-11-09 DIAGNOSIS — J3489 Other specified disorders of nose and nasal sinuses: Secondary | ICD-10-CM | POA: Diagnosis not present

## 2017-11-09 DIAGNOSIS — J0101 Acute recurrent maxillary sinusitis: Secondary | ICD-10-CM | POA: Diagnosis not present

## 2017-11-09 DIAGNOSIS — R519 Headache, unspecified: Secondary | ICD-10-CM

## 2017-11-09 MED ORDER — TRIAMCINOLONE ACETONIDE 55 MCG/ACT NA AERO: 2.0000 | INHALATION_SPRAY | Freq: Every day | NASAL | 12 refills | Status: DC

## 2017-11-09 MED ORDER — PSEUDOEPHEDRINE-GUAIFENESIN ER 60-600 MG PO TB12: 1.0000 | ORAL_TABLET | Freq: Two times a day (BID) | ORAL | 1 refills | Status: AC

## 2017-11-09 MED ORDER — AMOXICILLIN-POT CLAVULANATE 875-125 MG PO TABS: 1.0000 | ORAL_TABLET | Freq: Two times a day (BID) | ORAL | 0 refills | Status: AC

## 2017-11-09 NOTE — Patient Instructions (Addendum)
   If you have lab work done today you will be contacted with your lab results within the next 2 weeks.  If you have not heard from us then please contact us. The fastest way to get your results is to register for My Chart.   IF you received an x-ray today, you will receive an invoice from [facility]. Please contact [facility] at [phone] with questions or concerns regarding your invoice.   IF you received labwork today, you will receive an invoice from [facility]. Please contact [facility] at [phone] with questions or concerns regarding your invoice.   Our billing staff will not be able to assist you with questions regarding bills from these companies.  You will be contacted with the lab results as soon as they are available. The fastest way to get your results is to activate your My Chart account. Instructions are located on the last page of this paperwork. If you have not heard from us regarding the results in 2 weeks, please contact this office.     Sinusitis, Adult Sinusitis is soreness and inflammation of your sinuses. Sinuses are hollow spaces in the bones around your face. They are located:  Around your eyes.  In the middle of your forehead.  Behind your nose.  In your cheekbones.  Your sinuses and nasal passages are lined with a stringy fluid (mucus). Mucus normally drains out of your sinuses. When your nasal tissues get inflamed or swollen, the mucus can get trapped or blocked so air cannot flow through your sinuses. This lets bacteria, viruses, and funguses grow, and that leads to infection. Follow these instructions at home: Medicines  Take, use, or apply over-the-counter and prescription medicines only as told by your doctor. These may include nasal sprays.  If you were prescribed an antibiotic medicine, take it as told by your doctor. Do not stop taking the antibiotic even if you start to feel better. Hydrate and Humidify  Drink enough  water to keep your pee (urine) clear or pale yellow.  Use a cool mist humidifier to keep the humidity level in your home above 50%.  Breathe in steam for 10-15 minutes, 3-4 times a day or as told by your doctor. You can do this in the bathroom while a hot shower is running.  Try not to spend time in cool or dry air. Rest  Rest as much as possible.  Sleep with your head raised (elevated).  Make sure to get enough sleep each night. General instructions  Put a warm, moist washcloth on your face 3-4 times a day or as told by your doctor. This will help with discomfort.  Wash your hands often with soap and water. If there is no soap and water, use hand sanitizer.  Do not smoke. Avoid being around people who are smoking (secondhand smoke).  Keep all follow-up visits as told by your doctor. This is important. Contact a doctor if:  You have a fever.  Your symptoms get worse.  Your symptoms do not get better within 10 days. Get help right away if:  You have a very bad headache.  You cannot stop throwing up (vomiting).  You have pain or swelling around your face or eyes.  You have trouble seeing.  You feel confused.  Your neck is stiff.  You have trouble breathing. This information is not intended to replace advice given to you by your health care provider. Make sure you discuss any questions you have with your health   care provider. Document Released: 08/06/2007 Document Revised: 10/14/2015 Document Reviewed: 12/13/2014 Elsevier Interactive Patient Education  2018 Elsevier Inc.  

## 2017-11-09 NOTE — Progress Notes (Signed)
[name] Mccarthy 42 y.o.   Chief Complaint  Patient presents with  . Sinus Problem    with headache and pressure x 2 months    HISTORY OF PRESENT ILLNESS: This is a 42 y.o. female complaining of sinus pressure and sinus headache for the past 2 months but worse the past week.  Denies fever or chills denies nausea or vomiting.  Denies dizziness or syncope.  No other significant symptoms.  Has a history of chronic recurrent sinusitis since she was a teenager.  HPI   Prior to Admission medications   Medication Sig Start Date End Date Taking? Authorizing Provider  Biotin 1000 MCG tablet Take 1,000 mcg by mouth 3 (three) times daily.   Yes [provider]  Calcium Carb-Cholecalciferol (CALCIUM-VITAMIN D) 500-200 MG-UNIT tablet Take 1 tablet by mouth daily.   Yes [provider]  estradiol (CLIMARA - DOSED IN MG/24 HR) 0.1 mg/24hr patch estradiol 0.1 mg/24 hr weekly transdermal patch  Apply 1 patch every week by transdermal route.   Yes [provider]  ibuprofen (ADVIL,MOTRIN) 800 MG tablet Take 800 mg by mouth every 8 (eight) hours as needed.   Yes [provider]  Multiple Vitamin (MULTIVITAMIN) tablet Take 1 tablet by mouth daily.   Yes [provider]  Probiotic Product (PROBIOTIC DAILY PO) Take by mouth.   Yes [provider]  ammonium lactate (AMLACTIN) 12 % cream Apply topically as needed for dry skin. Patient not taking: Reported on 11/09/2017 08/26/17   Sherren Mocha, MD  hydrocortisone (ANUSOL-HC) 25 MG suppository INSERT 1 SUPPOSITORY ONCE A DAY RECTALLY Patient not taking: Reported on 11/09/2017 08/26/17   Sherren Mocha, MD  hydrocortisone valerate cream (WESTCORT) 0.2 % Apply 1 application topically 2 (two) times daily. Patient not taking: Reported on 11/09/2017 08/26/17   Sherren Mocha, MD  HYDROmorphone (DILAUDID) 2 MG tablet Take 1-2 tablets (2-4 mg total) by mouth every 4 (four) hours as needed for severe pain. Patient not taking:  Reported on 11/09/2017 03/13/17 03/13/18  Elodia Florence, PA-C  triamcinolone cream (KENALOG) 0.1 % Apply 1 application topically 2 (two) times daily. Mix to 1:1 ratio with Eucerin years Patient not taking: Reported on 11/09/2017 08/26/17   Sherren Mocha, MD  urea (CARMOL) 40 % CREA Apply 1 application topically 3 (three) times daily. Patient not taking: Reported on 11/09/2017 08/26/17   Sherren Mocha, MD    Allergies  Allergen Reactions  . Hydrocodone Shortness Of Breath, Itching and Palpitations  . Oxycodone Shortness Of Breath, Itching and Palpitations    Patient Active Problem List   Diagnosis Date Noted  . Endometriosis 07/29/2017  . 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 Androgenic alopecia   . 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 Dental crowns present   . Family history of adverse reaction to anesthesia    pt's mother had problem with anesthesia 30 yrs. ago in [place] - specifics unknown by pt.  . 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 MRI right x 2, left x1.  RIGHT ACL repair (Dr. Margreta Journey) 2011.  MRI after repair: 03/2009:1. ACL repair noted with intact graft. 2. 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 3. Trace knee effusion. 4. Low-level subcutaneous edema anterior to the patellar tendon and tibial tubercl    Past Surgical  History:  Procedure Laterality Date  . CESAREAN SECTION  09/03/2002  . CHROMOPERTUBATION  08/20/2001  . DIAGNOSTIC LAPAROSCOPY  08/20/2001  . KNEE ARTHROSCOPY Bilateral 2011   Rt ACL repair by Dr. Fara Chute  . KNEE ARTHROSCOPY WITH SUBCHONDROPLASTY Right 03/13/2017   Procedure: KNEE ARTHROSCOPY WITH SUBCHONDROPLASTY;  Surgeon: Marcene Corning, MD;  Location: [facility];  Service: Orthopedics;  Laterality: Right;    Social History   Socioeconomic  History  . Marital status: Significant Other    Spouse name: n/a  . Number of children: 1  . Years of education: Not on file  . Highest education level: Not on file  Occupational History  . Occupation: IT consultant  Social Needs  . Financial resource strain: Not on file  . Food insecurity:    Worry: Not on file    Inability: Not on file  . Transportation needs:    Medical: Not on file    Non-medical: Not on file  Tobacco Use  . Smoking status: Former Smoker    Last attempt to quit: 03/03/2007    Years since quitting: 10.6  . Smokeless tobacco: Never Used  Substance and Sexual Activity  . Alcohol use: No  . Drug use: No  . Sexual activity: Not on file  Lifestyle  . Physical activity:    Days per week: Not on file    Minutes per session: Not on file  . Stress: Not on file  Relationships  . Social connections:    Talks on phone: Not on file    Gets together: Not on file    Attends religious service: Not on file    Active member of club or organization: Not on file    Attends meetings of clubs or organizations: Not on file    Relationship status: Not on file  . Intimate partner violence:    Fear of current or ex partner: Not on file    Emotionally abused: Not on file    Physically abused: Not on file    Forced sexual activity: Not on file  Other Topics Concern  . Not on file  Social History Narrative   From [place]   Came to the [place] in 2000   Lives with her boyfriend (since 2000) and daughter.    Family History  Problem Relation Age of Onset  . Arthritis Mother        osteo  . Anesthesia problems Mother        details unknown by pt.  . Hypertension Mother   . Hypertension Father      Review of Systems  Constitutional: Negative.  Negative for chills and fever.  HENT: Positive for congestion and sinus pain. Negative for sore throat.   Eyes: Negative.  Negative for blurred vision and double vision.  Respiratory: Negative.  Negative for cough, shortness of breath  and wheezing.   Cardiovascular: Negative.  Negative for chest pain and palpitations.  Gastrointestinal: Negative.  Negative for abdominal pain, blood in stool, diarrhea, melena, nausea and vomiting.  Genitourinary: Negative.  Negative for dysuria and hematuria.  Musculoskeletal: Negative.  Negative for back pain, joint pain, myalgias and neck pain.  Skin: Negative.  Negative for rash.  Neurological: Positive for headaches. Negative for dizziness, sensory change and focal weakness.  Endo/Heme/Allergies: Negative.   All other systems reviewed and are negative.   Vitals:   11/09/17 1719  BP: 110/71  Pulse: 63  Resp: 16  Temp: 99.3 F (37.4 C)  SpO2: 98%  Physical Exam  Constitutional: She is oriented to person, place, and time. She appears well-developed and well-nourished.  HENT:  Head: Normocephalic and atraumatic.  Right Ear: External ear normal.  Left Ear: External ear normal.  Nose: Mucosal edema present. Right sinus exhibits maxillary sinus tenderness. Left sinus exhibits maxillary sinus tenderness.  Mouth/Throat: Oropharynx is clear and moist.  Eyes: Pupils are equal, round, and reactive to light. Conjunctivae and EOM are normal.  Neck: Normal range of motion. Neck supple.  Cardiovascular: Normal rate, regular rhythm and normal heart sounds.  Pulmonary/Chest: Effort normal and breath sounds normal.  Abdominal: Soft. Bowel sounds are normal. She exhibits no distension. There is no tenderness.  Musculoskeletal: Normal range of motion. She exhibits no edema or tenderness.  Lymphadenopathy:    She has no cervical adenopathy.  Neurological: She is alert and oriented to person, place, and time. No sensory deficit. She exhibits normal muscle tone.  Skin: Skin is warm and dry. Capillary refill takes less than 2 seconds.  Psychiatric: She has a normal mood and affect. Her behavior is normal.  Vitals reviewed.   A total of 25 minutes was spent in the room with the patient,  greater than 50% of which was in counseling/coordination of care regarding Differential diagnosis, management, treatment, medication, and need for follow-up. .  ASSESSMENT & PLAN: Kay was seen today for sinus problem.  Diagnoses and all orders for this visit:  Sinus headache  Sinus pressure -     triamcinolone (NASACORT) 55 MCG/ACT AERO nasal inhaler; Place 2 sprays into the nose daily. -     pseudoephedrine-guaifenesin (MUCINEX D) 60-600 MG 12 hr tablet; Take 1 tablet by mouth every 12 (twelve) hours for 5 days.  Acute recurrent maxillary sinusitis -     amoxicillin-clavulanate (AUGMENTIN) 875-125 MG tablet; Take 1 tablet by mouth 2 (two) times daily for 7 days.    Patient Instructions       If you have lab work done today you will be contacted with your lab results within the next 2 weeks.  If you have not heard from [place] then please contact us. The fastest way to get your results is to register for My Chart.   IF you received an x-ray today, you will receive an invoice from [facility]. Please contact [facility] at [phone] with questions or concerns regarding your invoice.   IF you received labwork today, you will receive an invoice from [facility]. Please contact [facility] at [phone] with questions or concerns regarding your invoice.   Our billing staff will not be able to assist you with questions regarding bills from these companies.  You will be contacted with the lab results as soon as they are available. The fastest way to get your results is to activate your My Chart account. Instructions are located on the last page of this paperwork. If you have not heard from [place] regarding the results in 2 weeks, please contact this office.     Sinusitis, Adult Sinusitis is soreness and inflammation of your sinuses. Sinuses are hollow spaces in the bones around your face. They are located:  Around your eyes.  In the middle of your forehead.  Behind  your nose.  In your cheekbones.  Your sinuses and nasal passages are lined with a stringy fluid (mucus). Mucus normally drains out of your sinuses. When your nasal tissues get inflamed or swollen, the mucus can get trapped or blocked so air cannot flow through your sinuses. This lets bacteria, viruses,  and funguses grow, and that leads to infection. Follow these instructions at home: Medicines  Take, use, or apply over-the-counter and prescription medicines only as told by your doctor. These may include nasal sprays.  If you were prescribed an antibiotic medicine, take it as told by your doctor. Do not stop taking the antibiotic even if you start to feel better. Hydrate and Humidify  Drink enough water to keep your pee (urine) clear or pale yellow.  Use a cool mist humidifier to keep the humidity level in your home above 50%.  Breathe in steam for 10-15 minutes, 3-4 times a day or as told by your doctor. You can do this in the bathroom while a hot shower is running.  Try not to spend time in cool or dry air. Rest  Rest as much as possible.  Sleep with your head raised (elevated).  Make sure to get enough sleep each night. General instructions  Put a warm, moist washcloth on your face 3-4 times a day or as told by your doctor. This will help with discomfort.  Wash your hands often with soap and water. If there is no soap and water, use hand sanitizer.  Do not smoke. Avoid being around people who are smoking (secondhand smoke).  Keep all follow-up visits as told by your doctor. This is important. Contact a doctor if:  You have a fever.  Your symptoms get worse.  Your symptoms do not get better within 10 days. Get help right away if:  You have a very bad headache.  You cannot stop throwing up (vomiting).  You have pain or swelling around your face or eyes.  You have trouble seeing.  You feel confused.  Your neck is stiff.  You have trouble breathing. This  information is not intended to replace advice given to you by your health care provider. Make sure you discuss any questions you have with your health care provider. Document Released: 08/06/2007 Document Revised: 10/14/2015 Document Reviewed: 12/13/2014 Elsevier Interactive Patient Education  2018 Elsevier Inc.      Edwina Barth, MD [facility]

## 2017-11-10 ENCOUNTER — Other Ambulatory Visit: Payer: Self-pay | Admitting: Emergency Medicine

## 2017-11-10 NOTE — Telephone Encounter (Signed)
Will route to office for provider review; pt seen in office 11/09/17 by Dr Alvy Bimler.

## 2017-11-10 NOTE — Telephone Encounter (Signed)
Copied from CRM [id number]. Topic: Quick Communication - See Telephone Encounter >> Nov 10, 2017 10:21 AM Arlyss Gandy, NT wrote: CRM for notification. See Telephone encounter for: 11/10/17. Pt calling to see if diflucan can be sent in to [id number] [place], [place] - [address] [phone] (Phone) [phone] (Fax)  She states that when she takes antibiotics she gets yeast infections and would like to go ahead and have this medicine on hand.

## 2018-01-18 LAB — HM PAP SMEAR

## 2018-05-13 ENCOUNTER — Encounter: Payer: Self-pay | Admitting: *Deleted

## 2018-05-13 NOTE — Progress Notes (Signed)
[facility] (menopausal symptoms)

## 2019-05-17 ENCOUNTER — Ambulatory Visit (INDEPENDENT_AMBULATORY_CARE_PROVIDER_SITE_OTHER): Payer: 59 | Admitting: Orthopaedic Surgery

## 2019-05-17 ENCOUNTER — Ambulatory Visit (INDEPENDENT_AMBULATORY_CARE_PROVIDER_SITE_OTHER): Payer: 59

## 2019-05-17 ENCOUNTER — Encounter: Payer: Self-pay | Admitting: Orthopaedic Surgery

## 2019-05-17 ENCOUNTER — Other Ambulatory Visit: Payer: Self-pay

## 2019-05-17 VITALS — Ht 62.0 in | Wt 180.0 lb

## 2019-05-17 DIAGNOSIS — M25552 Pain in left hip: Secondary | ICD-10-CM

## 2019-05-17 NOTE — Progress Notes (Signed)
Office Visit Note   Patient: [name] Mccarthy           Date of Birth: 1975/04/19           MRN: [id number] Visit Date: 05/17/2019              Requested by: Shawnee Knapp, MD [facility] [place],  [place] [address] PCP: Shawnee Knapp, MD   Assessment & Plan: Visit Diagnoses:  1. Pain in left hip     Plan: My impression is chronic left lateral hip pain.  She continues to have pain despite conservative treatment therefore we will order an MRI to rule out structural abnormalities at this point.  Follow-up after the MRI.  Follow-Up Instructions: Return for follow up 10-14 days to review MRI.   Orders:  Orders Placed This Encounter  Procedures  . XR HIP UNILAT W OR W/O PELVIS 2-3 VIEWS LEFT  . MR Hip Left w/o contrast   No orders of the defined types were placed in this encounter.     Procedures: No procedures performed   Clinical Data: No additional findings.   Subjective: Chief Complaint  Patient presents with  . Left Hip - Pain    Patient is a very pleasant 44 year old female who comes in today for evaluation of chronic left hip pain for the last year without any injuries.  Lumbar spine recently obtained was normal.  Has had 2 months of physical therapy without significant relief. Denies any groin pain.  She is in pain management currently.  Had a cortisone injection with 2 months of relief.  The pain is worse with walking and lying on the left side.  Denies any numbness tingling weakness.  Denies any bowel or bladder dysfunction.   Review of Systems  Constitutional: Negative.   HENT: Negative.   Eyes: Negative.   Respiratory: Negative.   Cardiovascular: Negative.   Endocrine: Negative.   Musculoskeletal: Negative.   Neurological: Negative.   Hematological: Negative.   Psychiatric/Behavioral: Negative.   All other systems reviewed and are negative.    Objective: Vital Signs: Ht 5\' 2"  (1.575 m)   Wt 180 lb (81.6 kg)   BMI 32.92 kg/m   Physical  Exam Vitals and nursing note reviewed.  Constitutional:      Appearance: She is well-developed.  HENT:     Head: Normocephalic and atraumatic.  Pulmonary:     Effort: Pulmonary effort is normal.  Abdominal:     Palpations: Abdomen is soft.  Musculoskeletal:     Cervical back: Neck supple.  Skin:    General: Skin is warm.     Capillary Refill: Capillary refill takes less than 2 seconds.  Neurological:     Mental Status: She is alert and oriented to person, place, and time.  Psychiatric:        Behavior: Behavior normal.        Thought Content: Thought content normal.        Judgment: Judgment normal.     Ortho Exam Left hip exam shows point tenderness over the lateral side of the greater trochanter.  Pain with resisted hip abduction.  No groin pain.  No sciatic tension signs.  Negative Faber.  Negative FADIR. Specialty Comments:  No specialty comments available.  Imaging: XR HIP UNILAT W OR W/O PELVIS 2-3 VIEWS LEFT  Result Date: 05/17/2019 No acute or structure abnormalities.    PMFS History: Patient Active Problem List   Diagnosis Date Noted  . Endometriosis 07/29/2017  .  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 Androgenic alopecia   . 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 Dental crowns present   . Family history of adverse reaction to anesthesia    pt's mother had problem with anesthesia 30 yrs. ago in [place] - specifics unknown by pt.  . 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 MRI right x 2, left x1.  RIGHT ACL repair (Dr. Margreta Journey) 2011.  MRI after repair: 03/2009:1. ACL repair noted with intact graft. 2. 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 3. Trace knee effusion. 4. Low-level subcutaneous edema anterior to the patellar tendon and tibial tubercl    Family  History  Problem Relation Age of Onset  . Arthritis Mother        osteo  . Anesthesia problems Mother        details unknown by pt.  . Hypertension Mother   . Hypertension Father     Past Surgical History:  Procedure Laterality Date  . CESAREAN SECTION  09/03/2002  . CHROMOPERTUBATION  08/20/2001  . DIAGNOSTIC LAPAROSCOPY  08/20/2001  . KNEE ARTHROSCOPY Bilateral 2011   Rt ACL repair by Dr. Fara Chute  . KNEE ARTHROSCOPY WITH SUBCHONDROPLASTY Right 03/13/2017   Procedure: KNEE ARTHROSCOPY WITH SUBCHONDROPLASTY;  Surgeon: Marcene Corning, MD;  Location: [facility];  Service: Orthopedics;  Laterality: Right;   Social History   Occupational History  . Occupation: Paralegal  Tobacco Use  . Smoking status: Former Smoker    Quit date: 03/03/2007    Years since quitting: 12.2  . Smokeless tobacco: Never Used  Substance and Sexual Activity  . Alcohol use: No  . Drug use: No  . Sexual activity: Not on file

## 2019-06-07 ENCOUNTER — Ambulatory Visit: Payer: 59 | Admitting: Orthopaedic Surgery

## 2019-06-12 ENCOUNTER — Ambulatory Visit
Admission: RE | Admit: 2019-06-12 | Discharge: 2019-06-12 | Disposition: A | Discharge status: Home / Self Care | Payer: 59 | Source: Ambulatory Visit | Attending: Orthopaedic Surgery | Admitting: Orthopaedic Surgery

## 2019-06-12 ENCOUNTER — Other Ambulatory Visit: Payer: Self-pay

## 2019-06-12 DIAGNOSIS — M25552 Pain in left hip: Secondary | ICD-10-CM

## 2019-06-15 ENCOUNTER — Encounter: Payer: Self-pay | Admitting: Orthopaedic Surgery

## 2019-06-15 ENCOUNTER — Other Ambulatory Visit: Payer: Self-pay

## 2019-06-15 ENCOUNTER — Ambulatory Visit (INDEPENDENT_AMBULATORY_CARE_PROVIDER_SITE_OTHER): Payer: 59 | Admitting: Orthopaedic Surgery

## 2019-06-15 DIAGNOSIS — M25552 Pain in left hip: Secondary | ICD-10-CM | POA: Diagnosis not present

## 2019-06-15 NOTE — Progress Notes (Signed)
Office Visit Note   Patient: [name] Mccarthy           Date of Birth: [birth year]           MRN: [id number] Visit Date: 06/15/2019              Requested by: Sherren Mocha, MD [address] [place],  [place] [address] PCP: Sherren Mocha, MD   Assessment & Plan: Visit Diagnoses:  1. Pain in left hip     Plan: My impression based on MRI is.  Tendinosis bilaterally.  She has mild hip OA with labral tear.  These are asymptomatic based on the patient's report and physical exam.  She did have 2 months of relief with prior trochanteric bursa injections.  At this point I have recommended a combination of outpatient PT, weight loss, occasional cortisone injections.  We will refer patient to Dr. Prince Rome for periodic ultrasound guided trochanteric injections.  Follow-up as needed.  Follow-Up Instructions: Return if symptoms worsen or fail to improve, for needs appt with Dr. Prince Rome for bilateral trochanteric injections.   Orders:  No orders of the defined types were placed in this encounter.  No orders of the defined types were placed in this encounter.     Procedures: No procedures performed   Clinical Data: No additional findings.   Subjective: Chief Complaint  Patient presents with  . Left Hip - Pain    [name] comes back today for MRI review.   Review of Systems   Objective: Vital Signs: There were no vitals taken for this visit.  Physical Exam  Ortho Exam Exam is unchanged Specialty Comments:  No specialty comments available.  Imaging: No results found.   PMFS History: Patient Active Problem List   Diagnosis Date Noted  . Endometriosis 07/29/2017  . 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 Androgenic alopecia   . 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 Dental crowns present   . Family history of adverse reaction to anesthesia    pt's mother had problem with anesthesia  30 yrs. ago in [place] - specifics unknown by pt.  . 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 MRI right x 2, left x1.  RIGHT ACL repair (Dr. Margreta Journey) 2011.  MRI after repair: 03/2009:1. ACL repair noted with intact graft. 2. 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 3. Trace knee effusion. 4. Low-level subcutaneous edema anterior to the patellar tendon and tibial tubercl    Family History  Problem Relation Age of Onset  . Arthritis Mother        osteo  . Anesthesia problems Mother        details unknown by pt.  . Hypertension Mother   . Hypertension Father     Past Surgical History:  Procedure Laterality Date  . CESAREAN SECTION  09/03/2002  . CHROMOPERTUBATION  08/20/2001  . DIAGNOSTIC LAPAROSCOPY  08/20/2001  . KNEE ARTHROSCOPY Bilateral 2011   Rt ACL repair by Dr. Fara Chute  . KNEE ARTHROSCOPY WITH SUBCHONDROPLASTY Right 03/13/2017   Procedure: KNEE ARTHROSCOPY WITH SUBCHONDROPLASTY;  Surgeon: Marcene Corning, MD;  Location: [facility];  Service: Orthopedics;  Laterality: Right;   Social History   Occupational History  . Occupation: Paralegal  Tobacco Use  . Smoking status: Former Smoker    Quit date: 03/03/2007  Years since quitting: 12.2  . Smokeless tobacco: Never Used  Substance and Sexual Activity  . Alcohol use: No  . Drug use: No  . Sexual activity: Not on file

## 2019-06-24 ENCOUNTER — Ambulatory Visit (INDEPENDENT_AMBULATORY_CARE_PROVIDER_SITE_OTHER): Payer: 59 | Admitting: Family Medicine

## 2019-06-24 ENCOUNTER — Other Ambulatory Visit: Payer: Self-pay

## 2019-06-24 ENCOUNTER — Ambulatory Visit: Payer: Self-pay

## 2019-06-24 ENCOUNTER — Encounter: Payer: Self-pay | Admitting: Family Medicine

## 2019-06-24 DIAGNOSIS — M533 Sacrococcygeal disorders, not elsewhere classified: Secondary | ICD-10-CM

## 2019-06-24 DIAGNOSIS — M25552 Pain in left hip: Secondary | ICD-10-CM

## 2019-06-24 NOTE — Progress Notes (Signed)
Office Visit Note   Patient: [name] Mccarthy           Date of Birth: 1975-12-17           MRN: [id number] Visit Date: 06/24/2019 Requested by: Sherren Mocha, MD [address] [place],  [place] [address] PCP: Sherren Mocha, MD  Subjective: Chief Complaint  Patient presents with  . Left Hip - Pain    HPI: She is here for a planned left hip greater trochanter and SI joint injection.  Longstanding problems with these areas after having knee surgeries and being on crutches for a long time.  She has had previous injections in both areas with good results.  She had physical therapy as well.                ROS:   All other systems were reviewed and are negative.  Objective: Vital Signs: There were no vitals taken for this visit.  Physical Exam:  General:  Alert and oriented, in no acute distress. Pulm:  Breathing unlabored. Psy:  Normal mood, congruent affect. Skin: No rash Left hip: Point tender over the greater trochanter and the SI joint.  No pain with internal hip rotation.  Lower extremity strength and reflexes are normal.  Imaging: US Guided Needle Placement  Result Date: 06/24/2019 Ultrasound-guided right SI injection: After sterile prep with Betadine, injected 5 cc 1% lidocaine without epinephrine and 40 mg methylprednisolone using a 22-gauge spinal needle, passing the needle through the sacroiliac ligament into the region of the SI joint.  Good immediate relief. Ultrasound-guided right greater trochanter injection: After sterile prep with Betadine, injected 8 cc 1% lidocaine without epinephrine and 40 mg methylprednisolone into the gluteus medius tendon.  She had good immediate relief.    Assessment & Plan: 1.  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  Follow-up as needed.         PMFS History: Patient Active Problem List   Diagnosis Date Noted  . Endometriosis 07/29/2017  . 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 Androgenic alopecia   . 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 Dental crowns present   . Family history of adverse reaction to anesthesia    pt's mother had problem with anesthesia 30 yrs. ago in [place] - specifics unknown by pt.  . 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 MRI right x 2, left x1.  RIGHT ACL repair (Dr. Margreta Journey) 2011.  MRI after repair: 03/2009:1. ACL repair noted with intact graft. 2. 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 3. Trace knee effusion. 4. Low-level subcutaneous edema anterior to the patellar tendon and tibial tubercl    Family History  Problem Relation Age of Onset  . Arthritis Mother        osteo  . Anesthesia problems Mother        details unknown by pt.  . Hypertension Mother   . Hypertension Father     Past Surgical History:  Procedure Laterality Date  . CESAREAN SECTION  09/03/2002  . CHROMOPERTUBATION  08/20/2001  . DIAGNOSTIC LAPAROSCOPY  08/20/2001  . KNEE ARTHROSCOPY Bilateral 2011   Rt ACL repair by Dr. Fara Chute  . KNEE ARTHROSCOPY WITH SUBCHONDROPLASTY Right 03/13/2017   Procedure: KNEE ARTHROSCOPY WITH SUBCHONDROPLASTY;  Surgeon: Melrose Nakayama, MD;  Location: [facility];  Service: Orthopedics;  Laterality: Right;   Social History   Occupational History  . Occupation: Paralegal  Tobacco Use  . Smoking status: Former Smoker    Quit date: 03/03/2007    Years since quitting: 12.3  . Smokeless tobacco: Never Used  Substance and Sexual Activity  . Alcohol use: No  . Drug use: No  . Sexual activity: Not on file

## 2019-12-05 ENCOUNTER — Telehealth: Payer: Self-pay | Admitting: Family Medicine

## 2019-12-05 NOTE — Telephone Encounter (Signed)
Ok to schedule per Dr. Prince Rome. Coming in on Wednesday of this week.

## 2019-12-05 NOTE — Telephone Encounter (Signed)
I think this is for Dr. Prince Rome.

## 2019-12-05 NOTE — Telephone Encounter (Signed)
Pt called stating she would like to get bilateral trochanteric injections and I would like to get the ok to schedule her if we don't have to get them ordered and approved?  [phone]

## 2019-12-07 ENCOUNTER — Encounter: Payer: Self-pay | Admitting: Family Medicine

## 2019-12-07 ENCOUNTER — Other Ambulatory Visit: Payer: Self-pay

## 2019-12-07 ENCOUNTER — Ambulatory Visit: Payer: Self-pay

## 2019-12-07 ENCOUNTER — Ambulatory Visit (INDEPENDENT_AMBULATORY_CARE_PROVIDER_SITE_OTHER): Payer: 59 | Admitting: Family Medicine

## 2019-12-07 DIAGNOSIS — M25552 Pain in left hip: Secondary | ICD-10-CM | POA: Diagnosis not present

## 2019-12-07 DIAGNOSIS — M533 Sacrococcygeal disorders, not elsewhere classified: Secondary | ICD-10-CM

## 2019-12-07 NOTE — Progress Notes (Signed)
I saw and examined the patient with Dr. Marga Hoots and agree with assessment and plan as outlined.    Recurrent left hip pain.  Prior SI and GT injection gave relief, but not complete.  Would like injections again.  Prior lumbar MRI 2020 was unrevealing.  Hip MRI showed mild DJD.  Exam reveals left SI and GT tenderness.  LE strength normal.  Injected both today with ultrasound.  Good immediate relief.

## 2019-12-07 NOTE — Progress Notes (Signed)
Office Visit Note   Patient: [name] Mccarthy           Date of Birth: 08-20-1975           MRN: [id number] Visit Date: 12/07/2019 Requested by: Sherren Mocha, MD [address] [place],  [place] [address] PCP: Sherren Mocha, MD  Subjective: Chief Complaint  Patient presents with  . Left Hip - Pain, Follow-up    Requests cortisone injections, left troch and left SI joint  . Lower Back - Pain, Follow-up    HPI: 44yo F presenting to clinic for Left SI-Joint injection, and Left Greater trochanteric Injection. Patient has been struggling with pain in these areas for 2+ years, and states that the last time she had them injected her symptoms improved (but did not fully resolve) for several months. She would like to try injection therapy again today. She states that her pain will occasionally radiate down the back of her Left leg to her foot, but that this is not common. She also has some pain in the middle of her left buttocks, which is very tender to palpation.               ROS:   All other systems were reviewed and are negative.  Objective: Vital Signs: There were no vitals taken for this visit.  Physical Exam:  General:  Alert and oriented, in no acute distress. Pulm:  Breathing unlabored. Psy:  Normal mood, congruent affect. Skin:  No bruises, no rashes.   Normal gait. Tenderness to palpation over Left SI Joint. No significant tenderness over R SI Joint.  Tenderness within Left Sacral Notch.  Tenderness over Left greater trochanter.   Imaging: No results found.  Assessment & Plan: 44yo Female presenting to clinic for Left SI and Greater Trochanteric injection. Has had these in the past with mediocre success, but would like to try again. Injection therapy performed as described below.  -If no significant improvement with Injections today, patient should return to clinic for reevaluation.  -Strict return precautions were discussed.      Procedures: Left Sacroiliac and Greater  Trochanteric Bursa Cortisone Injections:  Risks and benefits of procedures discussed, Patient opted to proceed. Verbal Consent obtained.  Timeout performed.  Left SI Joint:  Skin prepped in a sterile fashion with betadine before further cleansing with alcohol. Ethyl Chloride was used for topical analgesia.  Left SI Joint was injected with 5cc 1% Lidocaine without epinephrine under Ultrasound Guidance using a 25G spinal needle. Syringe was removed from the needle, and 40mg  methylprednisolone was then injected into the joint.   Left Greater Trochanteric Injection:  Skin prepped in a sterile fashion with betadine before further cleansing with alcohol. Ethyl Chloride was used for topical analgesia.  Left Greater Trochanteric Bursa was injected with 5cc 1% Lidocaine without epinephrine under Ultrasound Guidance using a 25G spinal needle. Syringe was removed from the needle, and 40mg  methylprednisolone was then injected into the area.    Patient tolerated the injections well with no immediate complications. Aftercare instructions were discussed, and patient was given strict return precautions.      PMFS History: Patient Active Problem List   Diagnosis Date Noted  . Endometriosis 07/29/2017  . 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 Androgenic alopecia   . 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 Dental crowns present   . Family history of adverse reaction to anesthesia  pt's mother had problem with anesthesia 30 yrs. ago in [place] - specifics unknown by pt.  . 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 MRI right x 2, left x1.  RIGHT ACL repair (Dr. Margreta Journey) 2011.  MRI after repair: 03/2009:1. ACL repair noted with intact graft. 2. 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 3. Trace knee effusion.  4. Low-level subcutaneous edema anterior to the patellar tendon and tibial tubercl    Family History  Problem Relation Age of Onset  . Arthritis Mother        osteo  . Anesthesia problems Mother        details unknown by pt.  . Hypertension Mother   . Hypertension Father     Past Surgical History:  Procedure Laterality Date  . CESAREAN SECTION  09/03/2002  . CHROMOPERTUBATION  08/20/2001  . DIAGNOSTIC LAPAROSCOPY  08/20/2001  . KNEE ARTHROSCOPY Bilateral 2011   Rt ACL repair by Dr. Fara Chute  . KNEE ARTHROSCOPY WITH SUBCHONDROPLASTY Right 03/13/2017   Procedure: KNEE ARTHROSCOPY WITH SUBCHONDROPLASTY;  Surgeon: Marcene Corning, MD;  Location: [facility];  Service: Orthopedics;  Laterality: Right;   Social History   Occupational History  . Occupation: Paralegal  Tobacco Use  . Smoking status: Former Smoker    Quit date: 03/03/2007    Years since quitting: 12.7  . Smokeless tobacco: Never Used  Vaping Use  . Vaping Use: Never used  Substance and Sexual Activity  . Alcohol use: No  . Drug use: No  . Sexual activity: Not on file

## 2019-12-14 ENCOUNTER — Ambulatory Visit (INDEPENDENT_AMBULATORY_CARE_PROVIDER_SITE_OTHER): Payer: 59 | Admitting: Family Medicine

## 2019-12-14 ENCOUNTER — Encounter: Payer: Self-pay | Admitting: Family Medicine

## 2019-12-14 ENCOUNTER — Other Ambulatory Visit: Payer: Self-pay

## 2019-12-14 DIAGNOSIS — G8929 Other chronic pain: Secondary | ICD-10-CM | POA: Diagnosis not present

## 2019-12-14 DIAGNOSIS — M5442 Lumbago with sciatica, left side: Secondary | ICD-10-CM | POA: Diagnosis not present

## 2019-12-14 DIAGNOSIS — M25552 Pain in left hip: Secondary | ICD-10-CM | POA: Diagnosis not present

## 2019-12-14 MED ORDER — BACLOFEN 10 MG PO TABS: 5.0000 mg | ORAL_TABLET | Freq: Three times a day (TID) | ORAL | 3 refills | Status: DC | PRN

## 2019-12-14 MED ORDER — NABUMETONE 500 MG PO TABS: 500.0000 mg | ORAL_TABLET | Freq: Two times a day (BID) | ORAL | 3 refills | Status: DC | PRN

## 2019-12-14 NOTE — Progress Notes (Signed)
I saw and examined the patient with Dr. Marga Hoots and agree with assessment and plan as outlined.    Severe left posterolateral hip pain since last injection.  "Locked up" and was bed-bound for a day.  Pain into the leg, with cramping.  Exam reveals tenderness over L5-S1 level and left SI joint.  Neuro exam nonfocal.   Will order lumbar x-rays and MRI.

## 2019-12-14 NOTE — Progress Notes (Signed)
Office Visit Note   Patient: [name] Mccarthy           Date of Birth: Jun 02, 1975           MRN: [id number] Visit Date: 12/14/2019 Requested by: Sherren Mocha, MD [address] [place],  [place] [address] PCP: Sherren Mocha, MD  Subjective: Chief Complaint  Patient presents with  . Left Hip - Pain    Pain has worsened since the cortisone injection 12/07/19. Started with a cramping pain in the car on the way home from here. Tried heat & ice. Worse pain yesterday - not as bad today. Has been on bedrest for the most part.    HPI: 44 yo F presenting to clinic with concerns of ongoing pain since her steroid injections last week. She says that initially after the injection, she had some flushing and abdominal cramping 'Like I was about to start my period.' This has improved somewhat, however she continues to have a severe, stabbing pain in her left SI Joint and within her gluteal musculature. She says that she was walking her dog yesterday, and the pain was so bad she stopped midstep and was scared to move. She says 'I know something's not right.' Patient denies any fevers or redness of the injection site. No weakness/numbness in the leg. She says the first day after the injection, she felt some tingling along the back of her leg, but that this was very transient and has since resolved. She has tried Ice for pain at home, which offers very short-term relief.               ROS:   All other systems were reviewed and are negative.  Objective: Vital Signs: There were no vitals taken for this visit.  Physical Exam:  General:  Alert and oriented, in no acute distress. Pulm:  Breathing unlabored. Psy:  Normal mood, congruent affect. Skin:  Left lower back and gluteal area with no erythema. No warm, no bruises or rashes.   Musculoskeletal:  Antalgic gait favoring L leg 5/5 strength with hip flexion, abduction/adduction, and knee flexion/extension, ankle dorsi/plantarflexion.  Sensation intact. SLR  Negative  Significant tenderness to palpation over Left SI Joint, but also with some tenderness over midline around L5/S1. Also with marked tenderness in sacral notch. Minimal tenderness over greater trochanteric area.  FABER without worsening of SI Joint pain. FADIR Does cause lateral hip pain.   Imaging: No results found.  Assessment & Plan: 44yo F presenting to clinic with Left SI Joint/Gluteal pain since injection therapy last week. Unsure if pain is worsened after injection, or if injection simply failed to alleviate her symptoms. No fevers or erythema to suggest infection at either injection site.  -Given degree of discomfort, failure to improve with injection therapy, will obtain Lumbar MRI to better evaluate for possible pinched nerve as a source of her symptoms.  -Patient is agreeable with plan. We will contact with MRI results when available.      Procedures: No procedures performed  No notes on file     PMFS History: Patient Active Problem List   Diagnosis Date Noted  . Endometriosis 07/29/2017  . 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 Androgenic alopecia   . 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 Dental crowns present   . Family history of adverse reaction to anesthesia    pt's mother had problem with anesthesia  30 yrs. ago in [place] - specifics unknown by pt.  . 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 MRI right x 2, left x1.  RIGHT ACL repair (Dr. Margreta Journey) 2011.  MRI after repair: 03/2009:1. ACL repair noted with intact graft. 2. 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 3. Trace knee effusion. 4. Low-level subcutaneous edema anterior to the patellar tendon and tibial tubercl    Family History  Problem Relation Age of Onset  . Arthritis Mother        osteo  .  Anesthesia problems Mother        details unknown by pt.  . Hypertension Mother   . Hypertension Father     Past Surgical History:  Procedure Laterality Date  . CESAREAN SECTION  09/03/2002  . CHROMOPERTUBATION  08/20/2001  . DIAGNOSTIC LAPAROSCOPY  08/20/2001  . KNEE ARTHROSCOPY Bilateral 2011   Rt ACL repair by Dr. Fara Chute  . KNEE ARTHROSCOPY WITH SUBCHONDROPLASTY Right 03/13/2017   Procedure: KNEE ARTHROSCOPY WITH SUBCHONDROPLASTY;  Surgeon: Marcene Corning, MD;  Location: [facility];  Service: Orthopedics;  Laterality: Right;   Social History   Occupational History  . Occupation: Paralegal  Tobacco Use  . Smoking status: Former Smoker    Quit date: 03/03/2007    Years since quitting: 12.7  . Smokeless tobacco: Never Used  Vaping Use  . Vaping Use: Never used  Substance and Sexual Activity  . Alcohol use: No  . Drug use: No  . Sexual activity: Not on file

## 2020-01-01 ENCOUNTER — Ambulatory Visit
Admission: RE | Admit: 2020-01-01 | Discharge: 2020-01-01 | Disposition: A | Discharge status: Home / Self Care | Payer: 59 | Source: Ambulatory Visit | Attending: Family Medicine | Admitting: Family Medicine

## 2020-01-01 ENCOUNTER — Other Ambulatory Visit: Payer: Self-pay

## 2020-01-01 DIAGNOSIS — M25552 Pain in left hip: Secondary | ICD-10-CM

## 2020-01-01 DIAGNOSIS — M5442 Lumbago with sciatica, left side: Secondary | ICD-10-CM

## 2020-01-01 DIAGNOSIS — G8929 Other chronic pain: Secondary | ICD-10-CM

## 2020-01-02 ENCOUNTER — Telehealth: Payer: Self-pay | Admitting: Family Medicine

## 2020-01-02 DIAGNOSIS — G8929 Other chronic pain: Secondary | ICD-10-CM

## 2020-01-02 NOTE — Telephone Encounter (Signed)
Lumbar MRI scan shows a mild disc bulge at L4-5 which causes slight narrowing of the nerve openings on the left.  This could potentially cause left-sided symptoms.  Higher up in the spine, at T11-12, there is another disc protrusion which could contact the spinal cord and nerves and could potentially cause symptoms.  Since it was higher up, it was not fully visualized.  Could consider another trial of physical therapy, with attention being paid to the MRI findings.  Another option would be referral to Dr. Alvester Morin for consideration of spine injection.  There is no clear-cut indication for surgery based on the MRI results.

## 2020-01-03 NOTE — Telephone Encounter (Signed)
Orders placed for [facility] location

## 2020-01-03 NOTE — Addendum Note (Signed)
Addended by: Lillia Carmel on: 01/03/2020 11:41 AM   Modules accepted: Orders

## 2020-01-03 NOTE — Telephone Encounter (Signed)
I called and advised the patient of the MRI results. She would like to try PT and leave the spine injection as a final option. The patient has had PT at [facility] before, but would rather have it somewhere close to her home (lives on [address] in [place]).

## 2020-01-13 ENCOUNTER — Other Ambulatory Visit: Payer: Self-pay | Admitting: Obstetrics and Gynecology

## 2020-01-13 DIAGNOSIS — R928 Other abnormal and inconclusive findings on diagnostic imaging of breast: Secondary | ICD-10-CM

## 2020-01-18 ENCOUNTER — Ambulatory Visit
Admission: RE | Admit: 2020-01-18 | Discharge: 2020-01-18 | Disposition: A | Discharge status: Home / Self Care | Payer: 59 | Source: Ambulatory Visit | Attending: Obstetrics and Gynecology | Admitting: Obstetrics and Gynecology

## 2020-01-18 ENCOUNTER — Other Ambulatory Visit: Payer: Self-pay

## 2020-01-18 DIAGNOSIS — R928 Other abnormal and inconclusive findings on diagnostic imaging of breast: Secondary | ICD-10-CM

## 2020-02-28 ENCOUNTER — Ambulatory Visit: Payer: 59 | Attending: Family Medicine | Admitting: Physical Therapy

## 2020-02-28 ENCOUNTER — Encounter: Payer: Self-pay | Admitting: Physical Therapy

## 2020-02-28 ENCOUNTER — Other Ambulatory Visit: Payer: Self-pay

## 2020-02-28 DIAGNOSIS — M25552 Pain in left hip: Secondary | ICD-10-CM | POA: Insufficient documentation

## 2020-02-28 DIAGNOSIS — M6281 Muscle weakness (generalized): Secondary | ICD-10-CM | POA: Diagnosis present

## 2020-02-28 DIAGNOSIS — M545 Low back pain, unspecified: Secondary | ICD-10-CM | POA: Insufficient documentation

## 2020-02-28 DIAGNOSIS — G8929 Other chronic pain: Secondary | ICD-10-CM | POA: Insufficient documentation

## 2020-02-28 NOTE — Therapy (Signed)
[facility] [address], [address] [place], [place], [address] Phone: [phone]   Fax:  [phone]  Physical Therapy Evaluation  Patient Details  Name: [name] Mccarthy MRN: [id number] Date of Birth: 31-Mar-1975 Referring Provider (PT): Lavada Mesi, MD   Encounter Date: 02/28/2020   PT End of Session - 02/28/20 1701    Visit Number 1    Date for PT Re-Evaluation 04/24/20    Authorization Type [employer]    PT Start Time 1445    PT Stop Time 1530    PT Time Calculation (min) 45 min    Activity Tolerance Patient tolerated treatment well    Behavior During Therapy [facility] for tasks assessed/performed           Past Medical History:  Diagnosis Date   Androgenic alopecia    Chondromalacia of knee, 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 ago in [place] - specifics unknown by pt.   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 MRI right x 2, left x1.  RIGHT ACL repair (Dr. Margreta Journey) 2011.  MRI after repair: 03/2009:1. ACL repair noted with intact graft. 2. 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 3. Trace knee effusion. 4. Low-level subcutaneous edema anterior to the patellar tendon and tibial tubercl    Past Surgical History:  Procedure Laterality Date   CESAREAN SECTION  09/03/2002   CHROMOPERTUBATION  08/20/2001   DIAGNOSTIC LAPAROSCOPY  08/20/2001   KNEE ARTHROSCOPY Bilateral 2011   Rt ACL repair by Dr. Fara Chute   KNEE ARTHROSCOPY WITH SUBCHONDROPLASTY Right 03/13/2017   Procedure: KNEE ARTHROSCOPY WITH SUBCHONDROPLASTY;  Surgeon: Marcene Corning, MD;  Location: [facility];  Service: Orthopedics;  Laterality: Right;    There were no vitals filed for this visit.    Subjective Assessment -  02/28/20 1451    Subjective Pt with over 1 year of low back and hip pain following 2 knee surgeries.  Pain is concentrated in Lt buttock, hip and lateral and anterior thigh.  Interferes with sleep and walking.    Limitations Walking;Sitting    How long can you sit comfortably? 1 hour    How long can you walk comfortably? 1 mile, 15 min    Diagnostic tests hip and lumbar xray, MRI 2021: possible Lt hip superior labral tear, L4/5 mild annular fissure, mild facet hypertrophy, mild Lt foraminal stenosis  bone density: 2019 osteopenic    Patient Stated Goals reduce pain, sleep, walk for exercise up to 5 miles again (used to do daily)    Currently in Pain? Yes    Pain Score 7     Pain Location Buttocks    Pain Orientation Left    Pain Descriptors / Indicators Aching;Tightness    Pain Type Chronic pain;Acute pain    Pain Radiating Towards hip and lateral/anterior thigh    Pain Onset More than a month ago    Pain Frequency Intermittent    Aggravating Factors  walking, stairs    Pain Relieving Factors ice, laying on Rt              [facility] PT Assessment - 02/28/20 0001      Assessment   Medical Diagnosis M54.42,G89.29 (ICD-10-CM) - Chronic left-sided low back pain with left-sided sciatica  Referring Provider (PT) Hilts, Casimiro NeedleMichael, MD    Onset Date/Surgical Date --   approx 1-1.5 years ago   Hand Dominance Right    Next MD Visit as needed    Prior Therapy yes for hip and knees      Precautions   Precautions None      Restrictions   Weight Bearing Restrictions No      Balance Screen   Has the patient fallen in the past 6 months No      Home Environment   Living Environment Private residence    Living Arrangements Children;Spouse/significant other    Type of Home Apartment    Home Access Stairs to enter    Entrance Stairs-Number of Steps 14    Home Layout One level      Prior Function   Level of Independence Independent    Vocation Full time employment    Vocation Requirements  owns a trucking company, computer work    Leisure walk      Observation/Other Assessments   Focus on Therapeutic Outcomes (FOTO)  not entered in [facility]      Functional Tests   Functional tests Single leg stance;Squat      Squat   Comments able to perform squat without pain      Single Leg Stance   Comments able to balance on Rt/Lt in SLS, no pain      Posture/Postural Control   Posture/Postural Control Postural limitations    Postural Limitations Increased lumbar lordosis      ROM / Strength   AROM / PROM / Strength AROM;PROM;Strength      AROM   AROM Assessment Site Lumbar    Lumbar Flexion full no pain, good lumbar reversal    Lumbar Extension 15, no pain    Lumbar - Right Side Bend 10, pain on Lt    Lumbar - Left Side Bend 10, pain on Lt      PROM   Overall PROM Comments bil hip ER and IR limited      Strength   Overall Strength Comments LEs grossly 4+/5 with exception of hip flexion    Strength Assessment Site Hip    Right/Left Hip Right;Left    Right Hip Flexion 4-/5    Left Hip Flexion 4-/5      Flexibility   Soft Tissue Assessment /Muscle Length yes    Hamstrings limited 20%    Quadriceps limited 20%    ITB limited on Lt 20%    Piriformis limited 30% Lt, 20% Rt    Quadratus Lumborum limited on Lt 20%      Palpation   SI assessment  signif tenderness over Lt SI joint, fair mobility of bil SI joints    Palpation comment signif tenderness over Lt SI joint, tender trigger points present along Lt quads, ITB, glut med, glut min, glut max, piriformis, QL      Special Tests    Special Tests Hip Special Tests;Lumbar    Lumbar Tests Straight Leg Raise;Prone Knee Bend Test    Hip Special Tests  Luisa Hartatrick [facility]AB[facility]) Test      Prone Knee Bend Test   Findings Negative    Side Left      Straight Leg Raise   Findings Negative    Side  Left      Luisa HartPatrick (FABER) Test   Findings Negative    Side Left      Ambulation/Gait   Gait Pattern Trendelenburg   left, mild  Stair Management Technique Alternating pattern    Gait Comments slow on stairs due to Lt hip pain ascending>descending                      Objective measurements completed on examination: See above findings.                 PT Short Term Goals - 02/28/20 1711      PT SHORT TERM GOAL #1   Title Pt will be ind with initial HEP    Time 3    Period Weeks    Status New    Target Date 03/20/20      PT SHORT TERM GOAL #2   Title Pt will report improved sleep by at least 20%    Time 4    Period Weeks    Status New    Target Date 03/27/20      PT SHORT TERM GOAL #3   Title Pt will demo LE flexibility to [facility] to reduce undue strain on hip, pelvis and spine    Time 4    Period Weeks    Status New    Target Date 03/27/20      PT SHORT TERM GOAL #4   Title Pt will be able to demo proper standing posture and deep core canister activation in standing.    Time 4    Period Weeks    Status New    Target Date 03/27/20             PT Long Term Goals - 02/28/20 1714      PT LONG TERM GOAL #1   Title Pt will be ind with advanced HEP    Time 8    Period Weeks    Status New    Target Date 04/24/20      PT LONG TERM GOAL #2   Title Pt will be able to walk at least 2 miles with min pain to return to daily walking program.    Baseline 1 mile at most, used to walk 5 miles daily    Time 8    Period Weeks    Status New    Target Date 04/24/20      PT LONG TERM GOAL #3   Title Pt will improve functional strength for stairs by reporting min pain with apartment access  x14 stairs.    Time 8    Period Weeks    Status New    Target Date 04/24/20      PT LONG TERM GOAL #4   Title Pt will be able to sleep in varying positions and report improved sleep by at least 70%    Baseline can only lay on Rt side    Time 8    Period Weeks    Status New    Target Date 04/24/20      PT LONG TERM GOAL #5   Title Pt will demo improved core strength by maintaining good  posture and form with dynamic UE and LE functional challenges such as squat, lift, and carrying 10lb box    Time 8    Period Weeks    Status New    Target Date 04/24/20                  Plan - 02/28/20 1702    Clinical Impression Statement Pt is a pleasant, motivated 44yo female with chronic left sided low back, buttock, hip and anterior/lateral  thigh pain x 1+ years.  Pain began after having 2 knee surgeries.  Pain is worse with walking > 1 mile, sitting, and sleeping.  She can only sleep on Rt side.  She has had xrays, MRI and injections which have not helped.  She has a suspected Lt hip labral tear and multi-level mild Lt foraminal stenosis and facet hypertrophy.  She has full painfree ROM into lumbar flexion and extension but Lt sided pain with bil trunk SB.  She has limited hip rotation bil and signif flexibility restrictions in LEs, particularly Lt hip musculature.  She has weakness in bil hip flexors but otherwise is grossly 4+/5 for LE strength.  She has weakness in lumbar region and deep abdominals.  She had a c-section and has poor awareness of deep core.  She has trigger points along Lt quads, lateral and posterior hip and QL on Lt.  PT intiated lumbar ROM and LE stretching for HEP and gave info on DN.  Pt is interested in having DN and she is a good candidate given extensive trigger points.  She will benefit from skilled PT to address deficits, reduce pain, and improve tolerance of increased activity.    Personal Factors and Comorbidities Comorbidity 1    Comorbidities 2 past knee surgeries    Examination-Activity Limitations Locomotion Level;Sit    Examination-Participation Restrictions Cleaning;Laundry;Community Activity;Shop    Stability/Clinical Decision Making Stable/Uncomplicated    Clinical Decision Making Low    Rehab Potential Good    PT Frequency 2x / week    PT Duration 8 weeks    PT Treatment/Interventions ADLs/Self Care Home Management;Cryotherapy;Electrical  Stimulation;Moist Heat;Dry needling;Manual techniques;Therapeutic exercise;Neuromuscular re-education;Therapeutic activities;Functional mobility training;Patient/family education;Passive range of motion;Traction;Taping;Joint Manipulations;Spinal Manipulations    PT Next Visit Plan review HEP, neuro re-ed for core awareness, DN Lt gluts, piriformis, lateral quad    PT Home Exercise Plan Access Code: [email or web address]    Consulted and Agree with Plan of Care Patient           Patient will benefit from skilled therapeutic intervention in order to improve the following deficits and impairments:  Decreased range of motion,Postural dysfunction,Decreased strength,Impaired flexibility,Decreased activity tolerance,Pain,Increased muscle spasms,Improper body mechanics,Hypomobility  Visit Diagnosis: Chronic left-sided low back pain without sciatica - Plan: PT plan of care cert/re-cert  Pain in left hip - Plan: PT plan of care cert/re-cert  Muscle weakness (generalized) - Plan: PT plan of care cert/re-cert     Problem List Patient Active Problem List   Diagnosis Date Noted   Endometriosis 07/29/2017   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 [place], [place], [address] Phone: [phone]   Fax:  [phone]  Name: [name] Mccarthy MRN: [id number] Date of Birth: 1975/12/28

## 2020-02-28 NOTE — Patient Instructions (Addendum)
Access Code: [email or web address] URL: [email or web address] Date: 02/28/2020 Prepared by: Loistine Simas Katilyn Miltenberger  Exercises Supine Posterior Pelvic Tilt - 1 x daily - 7 x weekly - 1 sets - 15 reps Hooklying Single Knee to Chest Stretch - 1 x daily - 7 x weekly - 1 sets - 2 reps - 30 hold Supine Hamstring Stretch - 1 x daily - 7 x weekly - 2 reps - 30 hold Supine Figure 4 Piriformis Stretch - 1 x daily - 7 x weekly - 1 sets - 2 reps - 30 hold Supine Piriformis Stretch with Foot on Ground - 1 x daily - 7 x weekly - 1 sets - 3 reps - 30 hold Standing Quadriceps Stretch - 1 x daily - 7 x weekly - 1 sets - 2 reps - 30 hold Standing Hip Flexor Stretch - 1 x daily - 7 x weekly - 1 sets - 2 reps - 30 hold Cat-Camel - 1 x daily - 7 x weekly - 1 sets - 10 reps - 3 hold Child's Pose Stretch - 1 x daily - 7 x weekly - 1 sets - 3 reps - 10 hold Quadruped Transversus Abdominis Bracing - 1 x daily - 7 x weekly - 1 sets - 5 reps - 10 hold Standing Plank on Wall - 1 x daily - 7 x weekly - 3 sets - 10 reps - 10 hold   Trigger Point Dry Needling  . What is Trigger Point Dry Needling (DN)? o DN is a physical therapy technique used to treat muscle pain and dysfunction. Specifically, DN helps deactivate muscle trigger points (muscle knots).  o A thin filiform needle is used to penetrate the skin and stimulate the underlying trigger point. The goal is for a local twitch response (LTR) to occur and for the trigger point to relax. No medication of any kind is injected during the procedure.   . What Does Trigger Point Dry Needling Feel Like?  o The procedure feels different for each individual patient. Some patients report that they do not actually feel the needle enter the skin and overall the process is not painful. Very mild bleeding may occur. However, many patients feel a deep cramping in the muscle in which the needle was inserted. This is the local twitch response.   Marland Kitchen How Will I feel after the  treatment? o Soreness is normal, and the onset of soreness may not occur for a few hours. Typically this soreness does not last longer than two days.  o Bruising is uncommon, however; ice can be used to decrease any possible bruising.  o In rare cases feeling tired or nauseous after the treatment is normal. In addition, your symptoms may get worse before they get better, this period will typically not last longer than 24 hours.   . What Can I do After My Treatment? o Increase your hydration by drinking more water for the next 24 hours. o You may place ice or heat on the areas treated that have become sore, however, do not use heat on inflamed or bruised areas. Heat often brings more relief post needling. o You can continue your regular activities, but vigorous activity is not recommended initially after the treatment for 24 hours. o DN is best combined with other physical therapy such as strengthening, stretching, and other therapies.    [facility] [address], [address] [place], [place] [address] Phone # [phone] Fax [phone]

## 2020-03-01 ENCOUNTER — Ambulatory Visit: Payer: 59 | Admitting: Physical Therapy

## 2020-03-01 ENCOUNTER — Other Ambulatory Visit: Payer: Self-pay

## 2020-03-01 DIAGNOSIS — M545 Low back pain, unspecified: Secondary | ICD-10-CM | POA: Diagnosis not present

## 2020-03-01 DIAGNOSIS — M6281 Muscle weakness (generalized): Secondary | ICD-10-CM

## 2020-03-01 DIAGNOSIS — M25552 Pain in left hip: Secondary | ICD-10-CM

## 2020-03-01 DIAGNOSIS — G8929 Other chronic pain: Secondary | ICD-10-CM

## 2020-03-01 NOTE — Therapy (Signed)
[facility] [address], [address] [place], [place], [address] Phone: [phone]   Fax:  [phone]  Physical Therapy Treatment  Patient Details  Name: [name] Mccarthy MRN: [id number] Date of Birth: [birth year] Referring Provider (PT): Lavada Mesi, MD   Encounter Date: 03/01/2020   PT End of Session - 03/01/20 1403    Visit Number 2    Date for PT Re-Evaluation 04/24/20    Authorization Type [employer]    Authorization - Number of Visits 30    PT Start Time 1403    PT Stop Time 1443    PT Time Calculation (min) 40 min    Activity Tolerance Patient tolerated treatment well    Behavior During Therapy [facility] for tasks assessed/performed           Past Medical History:  Diagnosis Date   Androgenic alopecia    Chondromalacia of knee, 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 ago in [place] - specifics unknown by pt.   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 MRI right x 2, left x1.  RIGHT ACL repair (Dr. Margreta Journey) 2011.  MRI after repair: 03/2009:1. ACL repair noted with intact graft. 2. 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 3. Trace knee effusion. 4. Low-level subcutaneous edema anterior to the patellar tendon and tibial tubercl    Past Surgical History:  Procedure Laterality Date   CESAREAN SECTION  09/03/2002   CHROMOPERTUBATION  08/20/2001   DIAGNOSTIC LAPAROSCOPY  08/20/2001   KNEE ARTHROSCOPY Bilateral 2011   Rt ACL repair by Dr. Fara Chute   KNEE ARTHROSCOPY WITH SUBCHONDROPLASTY Right 03/13/2017   Procedure: KNEE ARTHROSCOPY WITH SUBCHONDROPLASTY;  Surgeon: Marcene Corning, MD;  Location: [facility];  Service: Orthopedics;  Laterality: Right;    There were no vitals filed for this  visit.   Subjective Assessment - 03/01/20 1506    Subjective The stretches feel good.  The one where I pull my knee across hurts in my back.  I went for a walk after last time and I think that did not help.  "I gained all this weight after my father passed away and maybe that is why my back is hurting.  I used to weigh 140lb"    Pertinent History father recently passed away from covid    Patient Stated Goals reduce pain, sleep, walk for exercise up to 5 miles again (used to do daily)    Currently in Pain? Yes    Pain Score 7     Pain Location Buttocks    Pain Orientation Left    Pain Descriptors / Indicators Aching    Pain Type Chronic pain    Pain Onset More than a month ago    Aggravating Factors  walking    Multiple Pain Sites No                             [facility] Adult PT Treatment/Exercise - 03/01/20 0001      Neuro Re-ed    Neuro Re-ed Details  belly and ribcage breathing; TC to expand and collapse ribcage      Exercises   Exercises Lumbar      Manual Therapy   Manual Therapy Soft tissue  mobilization;Myofascial release    Soft tissue mobilization Lt gluteals, TFL, vastus lateralis muscles    Myofascial Release lower abdomen around the scar incision using manual and IASTM with cup            Trigger Point Dry Needling - 03/01/20 0001    Consent Given? Yes    Education Handout Provided --   reviewed verbally, provide handout next   Muscles Treated Lower Quadrant Vastus lateralis    Muscles Treated Back/Hip Gluteus minimus;Gluteus medius    Vastus lateralis Response Twitch response elicited;Palpable increased muscle length    Gluteus Minimus Response Twitch response elicited;Palpable increased muscle length    Gluteus Medius Response Twitch response elicited;Palpable increased muscle length                PT Education - 03/01/20 1514    Education Details Access Code: [email or web address]    Person(s) Educated Patient    Methods  Explanation;Demonstration;Tactile cues;Verbal cues;Handout    Comprehension Verbalized understanding;Returned demonstration            PT Short Term Goals - 03/01/20 1456      PT SHORT TERM GOAL #1   Title Pt will be ind with initial HEP    Status Achieved             PT Long Term Goals - 02/28/20 1714      PT LONG TERM GOAL #1   Title Pt will be ind with advanced HEP    Time 8    Period Weeks    Status New    Target Date 04/24/20      PT LONG TERM GOAL #2   Title Pt will be able to walk at least 2 miles with min pain to return to daily walking program.    Baseline 1 mile at most, used to walk 5 miles daily    Time 8    Period Weeks    Status New    Target Date 04/24/20      PT LONG TERM GOAL #3   Title Pt will improve functional strength for stairs by reporting min pain with apartment access  x14 stairs.    Time 8    Period Weeks    Status New    Target Date 04/24/20      PT LONG TERM GOAL #4   Title Pt will be able to sleep in varying positions and report improved sleep by at least 70%    Baseline can only lay on Rt side    Time 8    Period Weeks    Status New    Target Date 04/24/20      PT LONG TERM GOAL #5   Title Pt will demo improved core strength by maintaining good posture and form with dynamic UE and LE functional challenges such as squat, lift, and carrying 10lb box    Time 8    Period Weeks    Status New    Target Date 04/24/20                 Plan - 03/01/20 1456    Clinical Impression Statement Pt has been successful with initial HEP and states most of the stretches are feeling good.  Today's session focused on soft tissue release throughout left gluteals and left lateral quads.  Pt also has tight c-section scar that released some with assistance from cupping technique.  Pt was able to get a little more abdominal engagement after abdominal release  and education on diaphragmatic breathing.  Pt was given diaphragm breathing in supine to  do at home as addition to HEP.  Pt will benefit from skilled PT to continue to work on soft tissue release for pain management and progress basic core strength.    PT Treatment/Interventions ADLs/Self Care Home Management;Cryotherapy;Electrical Stimulation;Moist Heat;Dry needling;Manual techniques;Therapeutic exercise;Neuromuscular re-education;Therapeutic activities;Functional mobility training;Patient/family education;Passive range of motion;Traction;Taping;Joint Manipulations;Spinal Manipulations    PT Next Visit Plan f/u on diaphragmatic breathing; TrA activation and basic core strength; f/u on DN to glutes and quad - continue DN as needed    PT Home Exercise Plan Access Code: [email or web address]    Consulted and Agree with Plan of Care Patient           Patient will benefit from skilled therapeutic intervention in order to improve the following deficits and impairments:  Decreased range of motion,Postural dysfunction,Decreased strength,Impaired flexibility,Decreased activity tolerance,Pain,Increased muscle spasms,Improper body mechanics,Hypomobility  Visit Diagnosis: Chronic left-sided low back pain without sciatica  Pain in left hip  Muscle weakness (generalized)     Problem List Patient Active Problem List   Diagnosis Date Noted   Endometriosis 07/29/2017   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 [place], [place], [address] Phone: [phone]   Fax:  [phone]  Name: [name] Mccarthy MRN: [id number] Date of Birth: August 15, 1975

## 2020-03-01 NOTE — Patient Instructions (Signed)
Access Code: [email or web address] URL: [email or web address] Date: 03/01/2020 Prepared by: Dwana Curd  Exercises Supine Posterior Pelvic Tilt - 1 x daily - 7 x weekly - 1 sets - 15 reps Hooklying Single Knee to Chest Stretch - 1 x daily - 7 x weekly - 1 sets - 2 reps - 30 hold Supine Hamstring Stretch - 1 x daily - 7 x weekly - 2 reps - 30 hold Supine Figure 4 Piriformis Stretch - 1 x daily - 7 x weekly - 1 sets - 2 reps - 30 hold Supine Piriformis Stretch with Foot on Ground - 1 x daily - 7 x weekly - 1 sets - 3 reps - 30 hold Standing Quadriceps Stretch - 1 x daily - 7 x weekly - 1 sets - 2 reps - 30 hold Standing Hip Flexor Stretch - 1 x daily - 7 x weekly - 1 sets - 2 reps - 30 hold Cat-Camel - 1 x daily - 7 x weekly - 1 sets - 10 reps - 3 hold Child's Pose Stretch - 1 x daily - 7 x weekly - 1 sets - 3 reps - 10 hold Quadruped Transversus Abdominis Bracing - 1 x daily - 7 x weekly - 1 sets - 5 reps - 10 hold Standing Plank on Wall - 1 x daily - 7 x weekly - 3 sets - 10 reps - 10 hold Supine Diaphragmatic Breathing - 3 x daily - 7 x weekly - 10 reps - 1 sets

## 2020-03-06 ENCOUNTER — Encounter: Payer: 59 | Admitting: Physical Therapy

## 2020-03-12 ENCOUNTER — Ambulatory Visit: Payer: 59 | Attending: Family Medicine

## 2020-03-12 ENCOUNTER — Other Ambulatory Visit: Payer: Self-pay

## 2020-03-12 DIAGNOSIS — M545 Low back pain, unspecified: Secondary | ICD-10-CM | POA: Diagnosis present

## 2020-03-12 DIAGNOSIS — G8929 Other chronic pain: Secondary | ICD-10-CM | POA: Diagnosis present

## 2020-03-12 DIAGNOSIS — M25552 Pain in left hip: Secondary | ICD-10-CM | POA: Insufficient documentation

## 2020-03-12 DIAGNOSIS — M6281 Muscle weakness (generalized): Secondary | ICD-10-CM | POA: Insufficient documentation

## 2020-03-12 NOTE — Patient Instructions (Signed)
Access Code: [email or web address] URL: [email or web address] Date: 03/12/2020 Prepared by: Tresa Endo  Exercises  Seated Hamstring Stretch - 3 x daily - 7 x weekly - 1 sets - 3 reps - 20-30 hold Seated Piriformis Stretch with Trunk Bend - 3 x daily - 7 x weekly - 1 sets - 3 reps - 20-30 hold

## 2020-03-12 NOTE — Therapy (Signed)
[facility] [address], [address] [place], [place], [address] Phone: [phone]   Fax:  [phone]  Physical Therapy Treatment  Patient Details  Name: [name] Mccarthy MRN: [id number] Date of Birth: 11/17/1975 Referring Provider (PT): Lavada Mesi, MD   Encounter Date: 03/12/2020   PT End of Session - 03/12/20 1313    Visit Number 3    Date for PT Re-Evaluation 04/24/20    Authorization Type [employer]    Authorization - Number of Visits 30    PT Start Time 1234    PT Stop Time 1313    PT Time Calculation (min) 39 min    Activity Tolerance Patient tolerated treatment well    Behavior During Therapy [facility] for tasks assessed/performed           Past Medical History:  Diagnosis Date  . Androgenic alopecia   . 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 Dental crowns present   . Family history of adverse reaction to anesthesia    pt's mother had problem with anesthesia 30 yrs. ago in [place] - specifics unknown by pt.  . 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 MRI right x 2, left x1.  RIGHT ACL repair (Dr. Margreta Journey) 2011.  MRI after repair: 03/2009:1. ACL repair noted with intact graft. 2. 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 3. Trace knee effusion. 4. Low-level subcutaneous edema anterior to the patellar tendon and tibial tubercl    Past Surgical History:  Procedure Laterality Date  . CESAREAN SECTION  09/03/2002  . CHROMOPERTUBATION  08/20/2001  . DIAGNOSTIC LAPAROSCOPY  08/20/2001  . KNEE ARTHROSCOPY Bilateral 2011   Rt ACL repair by Dr. Fara Chute  . KNEE ARTHROSCOPY WITH SUBCHONDROPLASTY Right 03/13/2017   Procedure: KNEE ARTHROSCOPY WITH SUBCHONDROPLASTY;  Surgeon: Marcene Corning, MD;  Location: [facility];  Service: Orthopedics;  Laterality: Right;    There were no vitals filed for this  visit.   Subjective Assessment - 03/12/20 1236    Subjective I am always having pain.  I had some pain and needed to ice after dry needling.  It wasn't too bad, but I didn't see a change.    Currently in Pain? Yes    Pain Score 7     Pain Location Buttocks    Pain Orientation Left    Pain Descriptors / Indicators Aching    Pain Onset More than a month ago    Pain Frequency Intermittent    Aggravating Factors  movement, walking, steps    Pain Relieving Factors I'm not even sure                             [facility] Adult PT Treatment/Exercise - 03/12/20 0001      Lumbar Exercises: Stretches   Active Hamstring Stretch Left;Right;3 reps;20 seconds    Active Hamstring Stretch Limitations seated    Single Knee to Chest Stretch 2 reps;20 seconds    Piriformis Stretch 2 reps;20 seconds    Piriformis Stretch Limitations seated      Lumbar Exercises: Supine   Ab Set 20 reps    Pelvic Tilt 10 reps      Lumbar Exercises: Sidelying   Clam 20 reps;Both    Clam Limitations tactile cues for alignment      Manual Therapy   Manual Therapy Soft tissue  mobilization;Myofascial release    Manual therapy comments Addaday to Lt gluteals and quads                  PT Education - 03/12/20 1259    Education Details Access Code: [email or web address]    Person(s) Educated Patient    Methods Explanation;Handout    Comprehension Returned demonstration;Verbalized understanding            PT Short Term Goals - 03/01/20 1456      PT SHORT TERM GOAL #1   Title Pt will be ind with initial HEP    Status Achieved             PT Long Term Goals - 02/28/20 1714      PT LONG TERM GOAL #1   Title Pt will be ind with advanced HEP    Time 8    Period Weeks    Status New    Target Date 04/24/20      PT LONG TERM GOAL #2   Title Pt will be able to walk at least 2 miles with min pain to return to daily walking program.    Baseline 1 mile at most, used to walk 5 miles daily    Time  8    Period Weeks    Status New    Target Date 04/24/20      PT LONG TERM GOAL #3   Title Pt will improve functional strength for stairs by reporting min pain with apartment access  x14 stairs.    Time 8    Period Weeks    Status New    Target Date 04/24/20      PT LONG TERM GOAL #4   Title Pt will be able to sleep in varying positions and report improved sleep by at least 70%    Baseline can only lay on Rt side    Time 8    Period Weeks    Status New    Target Date 04/24/20      PT LONG TERM GOAL #5   Title Pt will demo improved core strength by maintaining good posture and form with dynamic UE and LE functional challenges such as squat, lift, and carrying 10lb box    Time 8    Period Weeks    Status New    Target Date 04/24/20                 Plan - 03/12/20 1254    Clinical Impression Statement Pt arrived with 7/10 gluteal/LE pain.  PT added hamstring and figure 4 stretch in the seated position to improve ability to do throughout the day.  Pt with improved TA activation with pelvic tilt today.  PT provided verbal cues to reduce force of contraction due to pain with this.  Pt didn't have much change after dry needling although many trigger points in Lt gluteals and quads.  Addaday was used to address tissue mobility today and DN will be revisited next session.  Pt will continue to benefit from skilled PT to address chronic Lt LE and gluteal pain.    Rehab Potential Good    PT Frequency 2x / week    PT Duration 8 weeks    PT Treatment/Interventions ADLs/Self Care Home Management;Cryotherapy;Electrical Stimulation;Moist Heat;Dry needling;Manual techniques;Therapeutic exercise;Neuromuscular re-education;Therapeutic activities;Functional mobility training;Patient/family education;Passive range of motion;Traction;Taping;Joint Manipulations;Spinal Manipulations    PT Next Visit Plan TrA activation and basic core strength; flexiblity, lumbopelvic strength,  continue DN or Addaday  as needed    PT Home Exercise Plan Access Code: [email or web address]    Consulted and Agree with Plan of Care Patient           Patient will benefit from skilled therapeutic intervention in order to improve the following deficits and impairments:  Decreased range of motion,Postural dysfunction,Decreased strength,Impaired flexibility,Decreased activity tolerance,Pain,Increased muscle spasms,Improper body mechanics,Hypomobility  Visit Diagnosis: Chronic left-sided low back pain without sciatica  Pain in left hip  Muscle weakness (generalized)     Problem List Patient Active Problem List   Diagnosis Date Noted  . Endometriosis 07/29/2017  . 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 Female infertility, secondary 01/02/2014     Lorrene Reid, PT 03/12/20 1:14 PM  [facility] [address], [address] [place], [place], [address] Phone: [phone]   Fax:  [phone]  Name: [name] Mccarthy MRN: [id number] Date of Birth: 16-Jan-1976

## 2020-03-14 ENCOUNTER — Ambulatory Visit: Payer: 59

## 2020-03-14 ENCOUNTER — Other Ambulatory Visit: Payer: Self-pay

## 2020-03-14 DIAGNOSIS — G8929 Other chronic pain: Secondary | ICD-10-CM

## 2020-03-14 DIAGNOSIS — M545 Low back pain, unspecified: Secondary | ICD-10-CM | POA: Diagnosis not present

## 2020-03-14 DIAGNOSIS — M25552 Pain in left hip: Secondary | ICD-10-CM

## 2020-03-14 DIAGNOSIS — M6281 Muscle weakness (generalized): Secondary | ICD-10-CM

## 2020-03-14 NOTE — Therapy (Signed)
[facility] [address], [address] [place], [place], [address] Phone: [phone]   Fax:  [phone]  Physical Therapy Treatment  Patient Details  Name: [name] Mccarthy MRN: [id number] Date of Birth: 1975-08-12 Referring Provider (PT): Lavada Mesi, MD   Encounter Date: 03/14/2020   PT End of Session - 03/14/20 1321    Visit Number 4    Date for PT Re-Evaluation 04/24/20    Authorization Type [employer]    Authorization - Number of Visits 30    PT Start Time 1232    PT Stop Time 1314    PT Time Calculation (min) 42 min    Activity Tolerance Patient tolerated treatment well    Behavior During Therapy [facility] for tasks assessed/performed           Past Medical History:  Diagnosis Date  . Androgenic alopecia   . 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 Dental crowns present   . Family history of adverse reaction to anesthesia    pt's mother had problem with anesthesia 30 yrs. ago in [place] - specifics unknown by pt.  . 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 MRI right x 2, left x1.  RIGHT ACL repair (Dr. Margreta Journey) 2011.  MRI after repair: 03/2009:1. ACL repair noted with intact graft. 2. 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 3. Trace knee effusion. 4. Low-level subcutaneous edema anterior to the patellar tendon and tibial tubercl    Past Surgical History:  Procedure Laterality Date  . CESAREAN SECTION  09/03/2002  . CHROMOPERTUBATION  08/20/2001  . DIAGNOSTIC LAPAROSCOPY  08/20/2001  . KNEE ARTHROSCOPY Bilateral 2011   Rt ACL repair by Dr. Fara Chute  . KNEE ARTHROSCOPY WITH SUBCHONDROPLASTY Right 03/13/2017   Procedure: KNEE ARTHROSCOPY WITH SUBCHONDROPLASTY;  Surgeon: Marcene Corning, MD;  Location: [facility];  Service: Orthopedics;  Laterality: Right;    There were no vitals filed for this  visit.   Subjective Assessment - 03/14/20 1232    Subjective I felt really good after I was here last time and we did the Addaday.  Yesterday was not good though.    Currently in Pain? Yes    Pain Score 6     Pain Location Buttocks    Pain Orientation Left    Pain Descriptors / Indicators Aching    Pain Type Chronic pain    Pain Onset More than a month ago    Pain Frequency Intermittent    Aggravating Factors  walking a lot, movement    Pain Relieving Factors massager                             [facility] Adult PT Treatment/Exercise - 03/14/20 0001      Lumbar Exercises: Stretches   Active Hamstring Stretch Left;Right;3 reps;20 seconds    Active Hamstring Stretch Limitations seated and with strap in supine    Single Knee to Chest Stretch 2 reps;20 seconds    Piriformis Stretch 2 reps;20 seconds    Piriformis Stretch Limitations seated      Lumbar Exercises: Aerobic   Nustep Level 2x 6 minutes-PT present to discuss progress      Lumbar Exercises: Seated   Other Seated Lumbar Exercises clam with yellow loop- abdominal bracing 2x10      Lumbar Exercises: Supine   Straight Leg Raise  20 reps    Straight Leg Raises Limitations Rt and Lt with abdominal bracing      Manual Therapy   Manual Therapy Soft tissue mobilization;Myofascial release    Manual therapy comments Addaday to Lt gluteals and quads                    PT Short Term Goals - 03/01/20 1456      PT SHORT TERM GOAL #1   Title Pt will be ind with initial HEP    Status Achieved             PT Long Term Goals - 02/28/20 1714      PT LONG TERM GOAL #1   Title Pt will be ind with advanced HEP    Time 8    Period Weeks    Status New    Target Date 04/24/20      PT LONG TERM GOAL #2   Title Pt will be able to walk at least 2 miles with min pain to return to daily walking program.    Baseline 1 mile at most, used to walk 5 miles daily    Time 8    Period Weeks    Status New     Target Date 04/24/20      PT LONG TERM GOAL #3   Title Pt will improve functional strength for stairs by reporting min pain with apartment access  x14 stairs.    Time 8    Period Weeks    Status New    Target Date 04/24/20      PT LONG TERM GOAL #4   Title Pt will be able to sleep in varying positions and report improved sleep by at least 70%    Baseline can only lay on Rt side    Time 8    Period Weeks    Status New    Target Date 04/24/20      PT LONG TERM GOAL #5   Title Pt will demo improved core strength by maintaining good posture and form with dynamic UE and LE functional challenges such as squat, lift, and carrying 10lb box    Time 8    Period Weeks    Status New    Target Date 04/24/20                 Plan - 03/14/20 1243    Clinical Impression Statement Pt had good relief of symptoms after last session with Addaday tissue mobilization.  Pt has been consistent with LE flexibility exercises and lumbopelvic strength. Pt tolerated addition of NuStep and sit to stand without difficulty.  Pt requires tactile and verbal cues for TA activation and alignment with exercise.  Addaday was used to address tissue mobility again today due to good response last session. Pt will continue to benefit from skilled PT to address chronic Lt LE and gluteal pain.    PT Frequency 2x / week    PT Duration 8 weeks    PT Treatment/Interventions ADLs/Self Care Home Management;Cryotherapy;Electrical Stimulation;Moist Heat;Dry needling;Manual techniques;Therapeutic exercise;Neuromuscular re-education;Therapeutic activities;Functional mobility training;Patient/family education;Passive range of motion;Traction;Taping;Joint Manipulations;Spinal Manipulations    PT Next Visit Plan TrA activation and basic core strength; flexiblity, lumbopelvic strength,  continue DN or Addaday as needed    PT Home Exercise Plan Access Code: [email or web address]    Consulted and Agree with Plan of Care Patient            Patient will benefit from  skilled therapeutic intervention in order to improve the following deficits and impairments:  Decreased range of motion,Postural dysfunction,Decreased strength,Impaired flexibility,Decreased activity tolerance,Pain,Increased muscle spasms,Improper body mechanics,Hypomobility  Visit Diagnosis: Pain in left hip  Chronic left-sided low back pain without sciatica  Muscle weakness (generalized)     Problem List Patient Active Problem List   Diagnosis Date Noted  . Endometriosis 07/29/2017  . 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 Female infertility, secondary 01/02/2014    Lorrene Reid, PT 03/14/20 1:24 PM  [facility] [address] [place], [place], [address] Phone: [phone]   Fax:  [phone]  Name: [name] Mccarthy MRN: [id number] Date of Birth: 26-Nov-1975

## 2020-03-21 ENCOUNTER — Ambulatory Visit: Payer: 59

## 2020-03-26 ENCOUNTER — Other Ambulatory Visit: Payer: Self-pay

## 2020-03-26 ENCOUNTER — Ambulatory Visit: Payer: 59

## 2020-03-26 DIAGNOSIS — M6281 Muscle weakness (generalized): Secondary | ICD-10-CM

## 2020-03-26 DIAGNOSIS — M545 Low back pain, unspecified: Secondary | ICD-10-CM

## 2020-03-26 DIAGNOSIS — G8929 Other chronic pain: Secondary | ICD-10-CM

## 2020-03-26 DIAGNOSIS — M25552 Pain in left hip: Secondary | ICD-10-CM

## 2020-03-26 NOTE — Therapy (Signed)
[facility] [address], [address] [place], [place], [address] Phone: [phone]   Fax:  [phone]  Physical Therapy Treatment  Patient Details  Name: [name] Mccarthy MRN: [id number] Date of Birth: September 29, 1975 Referring Provider (PT): Lavada Mesi, MD   Encounter Date: 03/26/2020   PT End of Session - 03/26/20 1309    Visit Number 5    Date for PT Re-Evaluation 04/24/20    Authorization Type [employer]    Authorization - Number of Visits 30    PT Start Time 1231    PT Stop Time 1316    PT Time Calculation (min) 45 min    Activity Tolerance Patient tolerated treatment well    Behavior During Therapy [facility] for tasks assessed/performed           Past Medical History:  Diagnosis Date  . Androgenic alopecia   . 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 Dental crowns present   . Family history of adverse reaction to anesthesia    pt's mother had problem with anesthesia 30 yrs. ago in [place] - specifics unknown by pt.  . 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 MRI right x 2, left x1.  RIGHT ACL repair (Dr. Margreta Journey) 2011.  MRI after repair: 03/2009:1. ACL repair noted with intact graft. 2. 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 3. Trace knee effusion. 4. Low-level subcutaneous edema anterior to the patellar tendon and tibial tubercl    Past Surgical History:  Procedure Laterality Date  . CESAREAN SECTION  09/03/2002  . CHROMOPERTUBATION  08/20/2001  . DIAGNOSTIC LAPAROSCOPY  08/20/2001  . KNEE ARTHROSCOPY Bilateral 2011   Rt ACL repair by Dr. Fara Chute  . KNEE ARTHROSCOPY WITH SUBCHONDROPLASTY Right 03/13/2017   Procedure: KNEE ARTHROSCOPY WITH SUBCHONDROPLASTY;  Surgeon: Marcene Corning, MD;  Location: [facility];  Service: Orthopedics;  Laterality: Right;    There were no vitals filed for this  visit.   Subjective Assessment - 03/26/20 1235    Subjective Addaday continues to help.    Currently in Pain? Yes    Pain Score 6     Pain Location Buttocks    Pain Orientation Left    Pain Descriptors / Indicators Aching    Pain Type Chronic pain    Pain Onset More than a month ago    Pain Frequency Intermittent    Aggravating Factors  walking a lot, movement    Pain Relieving Factors massager, walking, exercises                             [facility] Adult PT Treatment/Exercise - 03/26/20 0001      Lumbar Exercises: Stretches   Active Hamstring Stretch Left;Right;3 reps;20 seconds    Active Hamstring Stretch Limitations seated and with strap in supine    Single Knee to Chest Stretch 2 reps;20 seconds    Single Knee to Chest Stretch Limitations diagonal to improve piriformis stretch    Piriformis Stretch 2 reps;20 seconds    Piriformis Stretch Limitations seated      Lumbar Exercises: Aerobic   Nustep Level 2x 6 minutes-PT present to discuss progress      Lumbar Exercises: Supine   Ab Set 20 reps    AB Set Limitations with ball squeeze 2x10, with green loop clamshell 2x10    Bridge 20 reps;5  seconds    Straight Leg Raise 20 reps    Straight Leg Raises Limitations Rt and Lt with abdominal bracing                    PT Short Term Goals - 03/26/20 1248      PT SHORT TERM GOAL #2   Title Pt will report improved sleep by at least 20%    Status Achieved      PT SHORT TERM GOAL #3   Title Pt will demo LE flexibility to [facility] to reduce undue strain on hip, pelvis and spine    Baseline Lt>Rt hip stiffness    Status On-going      PT SHORT TERM GOAL #4   Title Pt will be able to demo proper standing posture and deep core canister activation in standing.    Status On-going             PT Long Term Goals - 02/28/20 1714      PT LONG TERM GOAL #1   Title Pt will be ind with advanced HEP    Time 8    Period Weeks    Status New    Target Date  04/24/20      PT LONG TERM GOAL #2   Title Pt will be able to walk at least 2 miles with min pain to return to daily walking program.    Baseline 1 mile at most, used to walk 5 miles daily    Time 8    Period Weeks    Status New    Target Date 04/24/20      PT LONG TERM GOAL #3   Title Pt will improve functional strength for stairs by reporting min pain with apartment access  x14 stairs.    Time 8    Period Weeks    Status New    Target Date 04/24/20      PT LONG TERM GOAL #4   Title Pt will be able to sleep in varying positions and report improved sleep by at least 70%    Baseline can only lay on Rt side    Time 8    Period Weeks    Status New    Target Date 04/24/20      PT LONG TERM GOAL #5   Title Pt will demo improved core strength by maintaining good posture and form with dynamic UE and LE functional challenges such as squat, lift, and carrying 10lb box    Time 8    Period Weeks    Status New    Target Date 04/24/20                 Plan - 03/26/20 1252    Clinical Impression Statement Pt had good relief of symptoms after last session with Addaday tissue mobilization last session.  Pt has a massager at home and using it daily and this has helped to improve her sleep quality.  Pt has not been consistent with LE flexibility exercises and lumbopelvic strength due to school schedule. Pt tolerated addition of resisted band with hip abduction in hooklying.  Pt requires tactile and verbal cues for TA activation and alignment with exercise.  Addaday was used to address tissue mobility again today due to good response last session. Pt will continue to benefit from skilled PT to address chronic Lt LE and gluteal pain    PT Frequency 2x / week    PT Duration 8  weeks    PT Treatment/Interventions ADLs/Self Care Home Management;Cryotherapy;Electrical Stimulation;Moist Heat;Dry needling;Manual techniques;Therapeutic exercise;Neuromuscular re-education;Therapeutic  activities;Functional mobility training;Patient/family education;Passive range of motion;Traction;Taping;Joint Manipulations;Spinal Manipulations    PT Next Visit Plan Lumbopelvic strength, flexibility, endurance, manual to address tissue mobility    PT Home Exercise Plan Access Code: [email or web address]    Consulted and Agree with Plan of Care Patient           Patient will benefit from skilled therapeutic intervention in order to improve the following deficits and impairments:  Decreased range of motion,Postural dysfunction,Decreased strength,Impaired flexibility,Decreased activity tolerance,Pain,Increased muscle spasms,Improper body mechanics,Hypomobility  Visit Diagnosis: Pain in left hip  Chronic left-sided low back pain without sciatica  Muscle weakness (generalized)     Problem List Patient Active Problem List   Diagnosis Date Noted  . Endometriosis 07/29/2017  . 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 Female infertility, secondary 01/02/2014    Lorrene Reid, PT 03/26/20 1:12 PM   [facility] [address], [address] [place], [place], [address] Phone: [phone]   Fax:  [phone]  Name: [name] Mccarthy MRN: [id number] Date of Birth: 03/07/1975

## 2020-03-28 ENCOUNTER — Other Ambulatory Visit: Payer: Self-pay

## 2020-03-28 ENCOUNTER — Ambulatory Visit: Payer: 59

## 2020-03-28 DIAGNOSIS — M6281 Muscle weakness (generalized): Secondary | ICD-10-CM

## 2020-03-28 DIAGNOSIS — M25552 Pain in left hip: Secondary | ICD-10-CM

## 2020-03-28 DIAGNOSIS — G8929 Other chronic pain: Secondary | ICD-10-CM

## 2020-03-28 DIAGNOSIS — M545 Low back pain, unspecified: Secondary | ICD-10-CM | POA: Diagnosis not present

## 2020-03-28 NOTE — Therapy (Signed)
[facility] [address] [place], [place], [address] Phone: [phone]   Fax:  [phone]  Physical Therapy Treatment  Patient Details  Name: [name] Mccarthy MRN: [id number] Date of Birth: [birth year] Referring Provider (PT): Lavada Mesi, MD   Encounter Date: 03/28/2020   PT End of Session - 03/28/20 1300    Visit Number 6    Date for PT Re-Evaluation 04/24/20    Authorization Type [employer]    Authorization - Number of Visits 30    PT Start Time 1231    PT Stop Time 1320    PT Time Calculation (min) 49 min    Activity Tolerance Patient tolerated treatment well    Behavior During Therapy [facility] for tasks assessed/performed           Past Medical History:  Diagnosis Date  . Androgenic alopecia   . 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 Dental crowns present   . Family history of adverse reaction to anesthesia    pt's mother had problem with anesthesia 30 yrs. ago in [place] - specifics unknown by pt.  . 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 MRI right x 2, left x1.  RIGHT ACL repair (Dr. Margreta Journey) 2011.  MRI after repair: 03/2009:1. ACL repair noted with intact graft. 2. 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 3. Trace knee effusion. 4. Low-level subcutaneous edema anterior to the patellar tendon and tibial tubercl    Past Surgical History:  Procedure Laterality Date  . CESAREAN SECTION  09/03/2002  . CHROMOPERTUBATION  08/20/2001  . DIAGNOSTIC LAPAROSCOPY  08/20/2001  . KNEE ARTHROSCOPY Bilateral 2011   Rt ACL repair by Dr. Fara Chute  . KNEE ARTHROSCOPY WITH SUBCHONDROPLASTY Right 03/13/2017   Procedure: KNEE ARTHROSCOPY WITH SUBCHONDROPLASTY;  Surgeon: Marcene Corning, MD;  Location: [facility];  Service: Orthopedics;  Laterality: Right;    There were no vitals filed for this  visit.   Subjective Assessment - 03/28/20 1230    Subjective I had a lot of pain after last session and I am not sure why because we didn't do anything new.    Patient Stated Goals reduce pain, sleep, walk for exercise up to 5 miles again (used to do daily)    Currently in Pain? Yes    Pain Score 7     Pain Location Back    Pain Orientation Lower;Left;Right    Pain Descriptors / Indicators Aching;Shooting    Pain Type Chronic pain    Pain Onset More than a month ago    Pain Frequency Intermittent    Aggravating Factors  lifting leg, walking, movement    Pain Relieving Factors massager, walking, exercise                             [facility] Adult PT Treatment/Exercise - 03/28/20 0001      Lumbar Exercises: Stretches   Active Hamstring Stretch Left;Right;3 reps;20 seconds    Active Hamstring Stretch Limitations seated    Single Knee to Chest Stretch 2 reps;20 seconds    Single Knee to Chest Stretch Limitations diagonal to improve piriformis stretch    Piriformis Stretch 2 reps;20 seconds    Piriformis Stretch Limitations seated      Lumbar Exercises: Aerobic   Nustep Level 2x 6 minutes-PT present to discuss progress  Lumbar Exercises: Supine   Ab Set 20 reps    AB Set Limitations with ball squeeze 2x10    Straight Leg Raise 20 reps    Straight Leg Raises Limitations Rt and Lt with abdominal bracing      Modalities   Modalities Electrical Stimulation;Cryotherapy      Cryotherapy   Number Minutes Cryotherapy 15 Minutes    Cryotherapy Location Lumbar Spine    Type of Cryotherapy Ice pack      Electrical Stimulation   Electrical Stimulation Location Low back and bil gluteals    Electrical Stimulation Action IFC    Electrical Stimulation Parameters 15 minutes    Electrical Stimulation Goals Strength      Manual Therapy   Manual Therapy Soft tissue mobilization;Myofascial release    Manual therapy comments Addaday to Lt gluteals and quads                   PT Education - 03/28/20 1249    Education Details Home TENs unit information    Person(s) Educated Patient    Methods Explanation;Handout    Comprehension Verbalized understanding            PT Short Term Goals - 03/26/20 1248      PT SHORT TERM GOAL #2   Title Pt will report improved sleep by at least 20%    Status Achieved      PT SHORT TERM GOAL #3   Title Pt will demo LE flexibility to [facility] to reduce undue strain on hip, pelvis and spine    Baseline Lt>Rt hip stiffness    Status On-going      PT SHORT TERM GOAL #4   Title Pt will be able to demo proper standing posture and deep core canister activation in standing.    Status On-going             PT Long Term Goals - 02/28/20 1714      PT LONG TERM GOAL #1   Title Pt will be ind with advanced HEP    Time 8    Period Weeks    Status New    Target Date 04/24/20      PT LONG TERM GOAL #2   Title Pt will be able to walk at least 2 miles with min pain to return to daily walking program.    Baseline 1 mile at most, used to walk 5 miles daily    Time 8    Period Weeks    Status New    Target Date 04/24/20      PT LONG TERM GOAL #3   Title Pt will improve functional strength for stairs by reporting min pain with apartment access  x14 stairs.    Time 8    Period Weeks    Status New    Target Date 04/24/20      PT LONG TERM GOAL #4   Title Pt will be able to sleep in varying positions and report improved sleep by at least 70%    Baseline can only lay on Rt side    Time 8    Period Weeks    Status New    Target Date 04/24/20      PT LONG TERM GOAL #5   Title Pt will demo improved core strength by maintaining good posture and form with dynamic UE and LE functional challenges such as squat, lift, and carrying 10lb box    Time 8  Period Weeks    Status New    Target Date 04/24/20                 Plan - 03/28/20 1240    Clinical Impression Statement Pt arrived today with flare-up  of pain with increased LBP and Lt gluteal pain with unknown cause.  Pt tolerated gentle exercise today and PT provided education regarding home TENs unit for pain management.   Trial of electrical stimulation today in the clinic and pt had good pain relief after.  Addaday was used to address tissue mobility as pt has tension and trigger points in the lumbar spine and Lt gluteals. Pt will continue to benefit from skilled PT to address chronic Lt LE and gluteal pain.    PT Frequency 2x / week    PT Duration 8 weeks    PT Treatment/Interventions ADLs/Self Care Home Management;Cryotherapy;Electrical Stimulation;Moist Heat;Dry needling;Manual techniques;Therapeutic exercise;Neuromuscular re-education;Therapeutic activities;Functional mobility training;Patient/family education;Passive range of motion;Traction;Taping;Joint Manipulations;Spinal Manipulations    PT Next Visit Plan Lumbopelvic strength as tolerated,  flexibility, endurance, manual to address tissue mobility.  Pain management as needed    PT Home Exercise Plan Access Code: [email or web address]    Consulted and Agree with Plan of Care Patient           Patient will benefit from skilled therapeutic intervention in order to improve the following deficits and impairments:  Decreased range of motion,Postural dysfunction,Decreased strength,Impaired flexibility,Decreased activity tolerance,Pain,Increased muscle spasms,Improper body mechanics,Hypomobility  Visit Diagnosis: Pain in left hip  Chronic left-sided low back pain without sciatica  Muscle weakness (generalized)     Problem List Patient Active Problem List   Diagnosis Date Noted  . Endometriosis 07/29/2017  . 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 Female infertility, secondary 01/02/2014    Lorrene Reid, PT 03/28/20 1:04 PM  [facility] [address] [place], [place], [address] Phone: [phone]   Fax:   [phone]  Name: [name] Mccarthy MRN: [id number] Date of Birth: Jan 02, 1976

## 2020-04-02 ENCOUNTER — Ambulatory Visit: Payer: 59

## 2020-04-02 ENCOUNTER — Other Ambulatory Visit: Payer: Self-pay

## 2020-04-02 DIAGNOSIS — G8929 Other chronic pain: Secondary | ICD-10-CM

## 2020-04-02 DIAGNOSIS — M6281 Muscle weakness (generalized): Secondary | ICD-10-CM

## 2020-04-02 DIAGNOSIS — M25552 Pain in left hip: Secondary | ICD-10-CM

## 2020-04-02 DIAGNOSIS — M545 Low back pain, unspecified: Secondary | ICD-10-CM | POA: Diagnosis not present

## 2020-04-02 NOTE — Therapy (Signed)
[facility] [address] [place], [place], [address] Phone: [phone]   Fax:  [phone]  Physical Therapy Treatment  Patient Details  Name: [name] Mccarthy MRN: [id number] Date of Birth: September 03, 1975 Referring Provider (PT): Lavada Mesi, MD   Encounter Date: 04/02/2020   PT End of Session - 04/02/20 1528    Visit Number 7    Date for PT Re-Evaluation 04/24/20    Authorization Type [employer]    Authorization - Number of Visits 30    PT Start Time 1448    PT Stop Time 1540    PT Time Calculation (min) 52 min    Activity Tolerance Patient tolerated treatment well    Behavior During Therapy [facility] for tasks assessed/performed           Past Medical History:  Diagnosis Date  . Androgenic alopecia   . 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 Dental crowns present   . Family history of adverse reaction to anesthesia    pt's mother had problem with anesthesia 30 yrs. ago in [place] - specifics unknown by pt.  . 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 MRI right x 2, left x1.  RIGHT ACL repair (Dr. Margreta Journey) 2011.  MRI after repair: 03/2009:1. ACL repair noted with intact graft. 2. 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 3. Trace knee effusion. 4. Low-level subcutaneous edema anterior to the patellar tendon and tibial tubercl    Past Surgical History:  Procedure Laterality Date  . CESAREAN SECTION  09/03/2002  . CHROMOPERTUBATION  08/20/2001  . DIAGNOSTIC LAPAROSCOPY  08/20/2001  . KNEE ARTHROSCOPY Bilateral 2011   Rt ACL repair by Dr. Fara Chute  . KNEE ARTHROSCOPY WITH SUBCHONDROPLASTY Right 03/13/2017   Procedure: KNEE ARTHROSCOPY WITH SUBCHONDROPLASTY;  Surgeon: Marcene Corning, MD;  Location: [facility];  Service: Orthopedics;  Laterality: Right;    There were no vitals filed for this  visit.   Subjective Assessment - 04/02/20 1450    Subjective I am about the same.  The electrical stimulation helped me a lot.  I ordered a unit and it has been delayed.    Currently in Pain? Yes    Pain Score 6     Pain Location Back    Pain Orientation Lower;Left;Right    Pain Descriptors / Indicators Aching;Shooting    Pain Type Chronic pain    Pain Onset More than a month ago    Pain Frequency Intermittent    Aggravating Factors  sitting, walking    Pain Relieving Factors electrical stimulation, walking, exercise, massager                             [facility] Adult PT Treatment/Exercise - 04/02/20 0001      Lumbar Exercises: Stretches   Active Hamstring Stretch Left;Right;3 reps;20 seconds    Active Hamstring Stretch Limitations seated    Single Knee to Chest Stretch 2 reps;20 seconds    Single Knee to Chest Stretch Limitations diagonal to improve piriformis stretch      Lumbar Exercises: Aerobic   Nustep Level 2x 8 minutes-PT present to discuss progress      Lumbar Exercises: Standing   Other Standing Lumbar Exercises walking in reverse: 15# x10- focus on bracing and alignment    Other Standing Lumbar Exercises Pallof press: red 2x10  Modalities   Modalities Electrical Stimulation;Cryotherapy      Cryotherapy   Number Minutes Cryotherapy 15 Minutes    Cryotherapy Location Lumbar Spine    Type of Cryotherapy Ice pack      Electrical Stimulation   Electrical Stimulation Location Low back and bil gluteals    Electrical Stimulation Action IFC    Electrical Stimulation Parameters 15 minutes    Electrical Stimulation Goals Pain      Manual Therapy   Manual Therapy Soft tissue mobilization;Myofascial release    Manual therapy comments Addaday to Lt gluteals and quads                    PT Short Term Goals - 04/02/20 1453      PT SHORT TERM GOAL #1   Title Pt will be ind with initial HEP    Status Achieved      PT SHORT TERM GOAL #2    Title Pt will report improved sleep by at least 20%    Status Achieved      PT SHORT TERM GOAL #3   Title Pt will demo LE flexibility to [facility] to reduce undue strain on hip, pelvis and spine    Baseline Lt>Rt hip stiffness    Status On-going      PT SHORT TERM GOAL #4   Title Pt will be able to demo proper standing posture and deep core canister activation in standing.    Status Achieved             PT Long Term Goals - 02/28/20 1714      PT LONG TERM GOAL #1   Title Pt will be ind with advanced HEP    Time 8    Period Weeks    Status New    Target Date 04/24/20      PT LONG TERM GOAL #2   Title Pt will be able to walk at least 2 miles with min pain to return to daily walking program.    Baseline 1 mile at most, used to walk 5 miles daily    Time 8    Period Weeks    Status New    Target Date 04/24/20      PT LONG TERM GOAL #3   Title Pt will improve functional strength for stairs by reporting min pain with apartment access  x14 stairs.    Time 8    Period Weeks    Status New    Target Date 04/24/20      PT LONG TERM GOAL #4   Title Pt will be able to sleep in varying positions and report improved sleep by at least 70%    Baseline can only lay on Rt side    Time 8    Period Weeks    Status New    Target Date 04/24/20      PT LONG TERM GOAL #5   Title Pt will demo improved core strength by maintaining good posture and form with dynamic UE and LE functional challenges such as squat, lift, and carrying 10lb box    Time 8    Period Weeks    Status New    Target Date 04/24/20                 Plan - 04/02/20 1515    Clinical Impression Statement Pt had good relief from electrical stimulation last session and has ordered a unit that has not yet arrived.  Pt is able to tolerate all exercise in the clinic and does not have increased pain with activity.  Pt requires verbal and tactile cues for core activation and alignment with upright exercises.   Addaday  was used to address tissue mobility as pt has tension and trigger points in the lumbar spine and Lt gluteals. Pt will continue to benefit from skilled PT to address chronic Lt LE and gluteal pain.    PT Frequency 2x / week    PT Duration 8 weeks    PT Treatment/Interventions ADLs/Self Care Home Management;Cryotherapy;Electrical Stimulation;Moist Heat;Dry needling;Manual techniques;Therapeutic exercise;Neuromuscular re-education;Therapeutic activities;Functional mobility training;Patient/family education;Passive range of motion;Traction;Taping;Joint Manipulations;Spinal Manipulations    PT Next Visit Plan Lumbopelvic strength as tolerated,  flexibility, endurance, manual to address tissue mobility.  Pain management as needed    PT Home Exercise Plan Access Code: [email or web address]    Consulted and Agree with Plan of Care Patient           Patient will benefit from skilled therapeutic intervention in order to improve the following deficits and impairments:  Decreased range of motion,Postural dysfunction,Decreased strength,Impaired flexibility,Decreased activity tolerance,Pain,Increased muscle spasms,Improper body mechanics,Hypomobility  Visit Diagnosis: Pain in left hip  Chronic left-sided low back pain without sciatica  Muscle weakness (generalized)     Problem List Patient Active Problem List   Diagnosis Date Noted  . Endometriosis 07/29/2017  . 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 Female infertility, secondary 01/02/2014    Lorrene Reid, PT 04/02/20 3:30 PM  [facility] [address], [address] [place], [place], [address] Phone: [phone]   Fax:  [phone]  Name: [name] Mccarthy MRN: [id number] Date of Birth: 09/18/1975

## 2020-04-04 ENCOUNTER — Other Ambulatory Visit: Payer: Self-pay

## 2020-04-04 ENCOUNTER — Ambulatory Visit: Payer: 59 | Attending: Family Medicine

## 2020-04-04 DIAGNOSIS — M6281 Muscle weakness (generalized): Secondary | ICD-10-CM | POA: Insufficient documentation

## 2020-04-04 DIAGNOSIS — M545 Low back pain, unspecified: Secondary | ICD-10-CM | POA: Insufficient documentation

## 2020-04-04 DIAGNOSIS — M25552 Pain in left hip: Secondary | ICD-10-CM | POA: Insufficient documentation

## 2020-04-04 DIAGNOSIS — G8929 Other chronic pain: Secondary | ICD-10-CM | POA: Diagnosis present

## 2020-04-04 NOTE — Therapy (Signed)
[facility] [address] [place], [place], [address] Phone: [phone]   Fax:  [phone]  Physical Therapy Treatment  Patient Details  Name: Timmie Looman MRN: [id number] Date of Birth: [birth year] Referring Provider (PT): Lavada Mesi, MD   Encounter Date: 04/04/2020   PT End of Session - 04/04/20 1310    Visit Number 8    Date for PT Re-Evaluation 04/24/20    Authorization Type [employer]    Authorization - Visit Number 8    Authorization - Number of Visits 30    PT Start Time 1235    PT Stop Time 1308    PT Time Calculation (min) 33 min    Activity Tolerance Patient limited by pain    Behavior During Therapy [facility] for tasks assessed/performed           Past Medical History:  Diagnosis Date  . Androgenic alopecia   . 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 Dental crowns present   . Family history of adverse reaction to anesthesia    pt's mother had problem with anesthesia 30 yrs. ago in [place] - specifics unknown by pt.  . 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 MRI right x 2, left x1.  RIGHT ACL repair (Dr. Margreta Journey) 2011.  MRI after repair: 03/2009:1. ACL repair noted with intact graft. 2. 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 3. Trace knee effusion. 4. Low-level subcutaneous edema anterior to the patellar tendon and tibial tubercl    Past Surgical History:  Procedure Laterality Date  . CESAREAN SECTION  09/03/2002  . CHROMOPERTUBATION  08/20/2001  . DIAGNOSTIC LAPAROSCOPY  08/20/2001  . KNEE ARTHROSCOPY Bilateral 2011   Rt ACL repair by Dr. Fara Chute  . KNEE ARTHROSCOPY WITH SUBCHONDROPLASTY Right 03/13/2017   Procedure: KNEE ARTHROSCOPY WITH SUBCHONDROPLASTY;  Surgeon: Marcene Corning, MD;  [facility];  Service: Orthopedics;  Laterality: Right;    There were no  vitals filed for this visit.   Subjective Assessment - 04/04/20 1236    Subjective Monday was so bad after I was here.  I had 9/10 pain in the Lt gluteals.  I don't know why.  pain is better now.    Currently in Pain? Yes    Pain Score 7     Pain Location Buttocks    Pain Orientation Right;Left;Lower    Pain Descriptors / Indicators Aching;Shooting    Pain Type Chronic pain    Pain Onset More than a month ago    Pain Frequency Intermittent    Aggravating Factors  sitting, walking    Pain Relieving Factors electrical stimulation, walking, exercise, massager                             [facility] Adult PT Treatment/Exercise - 04/04/20 0001      Manual Therapy   Manual Therapy Soft tissue mobilization;Myofascial release    Manual therapy comments elongation to Lt gluteals and bil lumbar paraspinals            Trigger Point Dry Needling - 04/04/20 0001    Consent Given? Yes    Education Handout Provided Previously provided    Muscles Treated Back/Hip Gluteus minimus;Gluteus medius;Piriformis;Lumbar multifidi   Rt gluteals, bil multifidi   Gluteus Minimus Response Twitch response elicited;Palpable increased muscle length    Gluteus  Medius Response Twitch response elicited;Palpable increased muscle length    Piriformis Response Twitch response elicited;Palpable increased muscle length    Lumbar multifidi Response Twitch response elicited;Palpable increased muscle length                  PT Short Term Goals - 04/02/20 1453      PT SHORT TERM GOAL #1   Title Pt will be ind with initial HEP    Status Achieved      PT SHORT TERM GOAL #2   Title Pt will report improved sleep by at least 20%    Status Achieved      PT SHORT TERM GOAL #3   Title Pt will demo LE flexibility to [facility] to reduce undue strain on hip, pelvis and spine    Baseline Lt>Rt hip stiffness    Status On-going      PT SHORT TERM GOAL #4   Title Pt will be able to demo proper standing  posture and deep core canister activation in standing.    Status Achieved             PT Long Term Goals - 02/28/20 1714      PT LONG TERM GOAL #1   Title Pt will be ind with advanced HEP    Time 8    Period Weeks    Status New    Target Date 04/24/20      PT LONG TERM GOAL #2   Title Pt will be able to walk at least 2 miles with min pain to return to daily walking program.    Baseline 1 mile at most, used to walk 5 miles daily    Time 8    Period Weeks    Status New    Target Date 04/24/20      PT LONG TERM GOAL #3   Title Pt will improve functional strength for stairs by reporting min pain with apartment access  x14 stairs.    Time 8    Period Weeks    Status New    Target Date 04/24/20      PT LONG TERM GOAL #4   Title Pt will be able to sleep in varying positions and report improved sleep by at least 70%    Baseline can only lay on Rt side    Time 8    Period Weeks    Status New    Target Date 04/24/20      PT LONG TERM GOAL #5   Title Pt will demo improved core strength by maintaining good posture and form with dynamic UE and LE functional challenges such as squat, lift, and carrying 10lb box    Time 8    Period Weeks    Status New    Target Date 04/24/20                 Plan - 04/04/20 1309    Clinical Impression Statement Pt arrived today with a flare-up after last session and reports 7-9/10 pain.  Session focused on manual therapy to address tension and pain in the lumbar spine and Lt gluteals.  Pt with tension and palpable tenderness over bil lumbar paraspinals and trigger points in Lt proximal gluteals.  Pt had good response to needling with multiple twitch responses.  Pt had improved mobility and reduced palpable tenderness over lumbar spine and Lt gluteals and pt reports 4/10 pain after treatment.   Pt will continue to benefit from skilled  PT to address chronic Lt LE and gluteal pain.    PT Frequency 2x / week    PT Duration 8 weeks    PT  Treatment/Interventions ADLs/Self Care Home Management;Cryotherapy;Electrical Stimulation;Moist Heat;Dry needling;Manual techniques;Therapeutic exercise;Neuromuscular re-education;Therapeutic activities;Functional mobility training;Patient/family education;Passive range of motion;Traction;Taping;Joint Manipulations;Spinal Manipulations    PT Next Visit Plan assess response to dry needling, repeat if helpful.    PT Home Exercise Plan Access Code: [email or web address]    Consulted and Agree with Plan of Care Patient           Patient will benefit from skilled therapeutic intervention in order to improve the following deficits and impairments:  Decreased range of motion,Postural dysfunction,Decreased strength,Impaired flexibility,Decreased activity tolerance,Pain,Increased muscle spasms,Improper body mechanics,Hypomobility  Visit Diagnosis: Pain in left hip  Chronic left-sided low back pain without sciatica  Muscle weakness (generalized)     Problem List Patient Active Problem List   Diagnosis Date Noted  . Endometriosis 07/29/2017  . 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 Female infertility, secondary 01/02/2014     Lorrene Reid, PT 04/04/20 1:11 PM  [facility] [address] [place], [place], [address] Phone: [phone]   Fax:  [phone]  Name: Kirbie Stodghill MRN: [id number] Date of Birth: 20-Nov-1975

## 2020-04-09 ENCOUNTER — Ambulatory Visit: Payer: 59

## 2020-04-09 ENCOUNTER — Other Ambulatory Visit: Payer: Self-pay

## 2020-04-09 DIAGNOSIS — M6281 Muscle weakness (generalized): Secondary | ICD-10-CM

## 2020-04-09 DIAGNOSIS — M25552 Pain in left hip: Secondary | ICD-10-CM | POA: Diagnosis not present

## 2020-04-09 DIAGNOSIS — G8929 Other chronic pain: Secondary | ICD-10-CM

## 2020-04-09 NOTE — Therapy (Signed)
[facility] [address], [address] [place], [place], [address] Phone: [phone]   Fax:  [phone]  Physical Therapy Treatment  Patient Details  Name: [name] Mccarthy MRN: [id number] Date of Birth: 18-Mar-1975 Referring Provider (PT): Lavada Mesi, MD   Encounter Date: 04/09/2020   PT End of Session - 04/09/20 1443    Visit Number 9    Date for PT Re-Evaluation 04/24/20    Authorization Type [employer]    Authorization - Visit Number 9    Authorization - Number of Visits 30    PT Start Time 1400    PT Stop Time 1444    PT Time Calculation (min) 44 min    Activity Tolerance Patient tolerated treatment well    Behavior During Therapy [facility] for tasks assessed/performed           Past Medical History:  Diagnosis Date  . Androgenic alopecia   . 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 Dental crowns present   . Family history of adverse reaction to anesthesia    pt's mother had problem with anesthesia 30 yrs. ago in [place] - specifics unknown by pt.  . 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 MRI right x 2, left x1.  RIGHT ACL repair (Dr. Margreta Journey) 2011.  MRI after repair: 03/2009:1. ACL repair noted with intact graft. 2. 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 3. Trace knee effusion. 4. Low-level subcutaneous edema anterior to the patellar tendon and tibial tubercl    Past Surgical History:  Procedure Laterality Date  . CESAREAN SECTION  09/03/2002  . CHROMOPERTUBATION  08/20/2001  . DIAGNOSTIC LAPAROSCOPY  08/20/2001  . KNEE ARTHROSCOPY Bilateral 2011   Rt ACL repair by Dr. Fara Chute  . KNEE ARTHROSCOPY WITH SUBCHONDROPLASTY Right 03/13/2017   Procedure: KNEE ARTHROSCOPY WITH SUBCHONDROPLASTY;  Surgeon: Marcene Corning, MD;  Location: [facility];  Service: Orthopedics;  Laterality: Right;    There  were no vitals filed for this visit.   Subjective Assessment - 04/09/20 1401    Subjective I was sore after DN but I think it helped.  I had less pain over the weekend.    Pain Score 6     Pain Location Buttocks    Pain Orientation Right;Left;Lower    Pain Descriptors / Indicators Aching;Shooting    Pain Type Chronic pain    Pain Onset More than a month ago    Pain Frequency Intermittent    Aggravating Factors  sitting, walking    Pain Relieving Factors electrical stimulation, walking, exercise                             [facility] Adult PT Treatment/Exercise - 04/09/20 0001      Lumbar Exercises: Stretches   Active Hamstring Stretch Left;Right;3 reps;20 seconds    Active Hamstring Stretch Limitations seated      Lumbar Exercises: Aerobic   Nustep Level 2x 8 minutes-PT present to discuss progress   PT present to discuss progress     Lumbar Exercises: Supine   Ab Set 20 reps    AB Set Limitations with ball squeeze 2x10    Clam 20 reps      Manual Therapy   Manual Therapy Soft tissue mobilization;Myofascial release    Manual therapy comments elongation to Lt gluteals and bil lumbar paraspinals  Trigger Point Dry Needling - 04/09/20 0001    Consent Given? Yes    Education Handout Provided Previously provided    Muscles Treated Back/Hip Gluteus minimus;Gluteus medius;Piriformis;Lumbar multifidi   Rt gluteals, bil multifidi   Gluteus Minimus Response Twitch response elicited;Palpable increased muscle length    Gluteus Medius Response Twitch response elicited;Palpable increased muscle length    Piriformis Response Twitch response elicited;Palpable increased muscle length    Lumbar multifidi Response Twitch response elicited;Palpable increased muscle length                  PT Short Term Goals - 04/02/20 1453      PT SHORT TERM GOAL #1   Title Pt will be ind with initial HEP    Status Achieved      PT SHORT TERM GOAL #2   Title Pt will  report improved sleep by at least 20%    Status Achieved      PT SHORT TERM GOAL #3   Title Pt will demo LE flexibility to [facility] to reduce undue strain on hip, pelvis and spine    Baseline Lt>Rt hip stiffness    Status On-going      PT SHORT TERM GOAL #4   Title Pt will be able to demo proper standing posture and deep core canister activation in standing.    Status Achieved             PT Long Term Goals - 02/28/20 1714      PT LONG TERM GOAL #1   Title Pt will be ind with advanced HEP    Time 8    Period Weeks    Status New    Target Date 04/24/20      PT LONG TERM GOAL #2   Title Pt will be able to walk at least 2 miles with min pain to return to daily walking program.    Baseline 1 mile at most, used to walk 5 miles daily    Time 8    Period Weeks    Status New    Target Date 04/24/20      PT LONG TERM GOAL #3   Title Pt will improve functional strength for stairs by reporting min pain with apartment access  x14 stairs.    Time 8    Period Weeks    Status New    Target Date 04/24/20      PT LONG TERM GOAL #4   Title Pt will be able to sleep in varying positions and report improved sleep by at least 70%    Baseline can only lay on Rt side    Time 8    Period Weeks    Status New    Target Date 04/24/20      PT LONG TERM GOAL #5   Title Pt will demo improved core strength by maintaining good posture and form with dynamic UE and LE functional challenges such as squat, lift, and carrying 10lb box    Time 8    Period Weeks    Status New    Target Date 04/24/20                 Plan - 04/09/20 1415    Clinical Impression Statement Pt had relief of symptoms after Dry needling after ~1 day of soreness.  Pt reports 4/10 pain in the Lt gluteals over the weekend.   Pt with tension and palpable tenderness over bil lumbar paraspinals and trigger  points in Lt proximal gluteals.  Pt had good response to needling with multiple twitch responses.  Pt had improved  mobility and reduced palpable tenderness over lumbar spine and Lt gluteals and pt reports reduced pain after treatment.   Pt will continue to benefit from skilled PT to address chronic Lt LE and gluteal pain.    PT Frequency 2x / week    PT Duration 8 weeks    PT Treatment/Interventions ADLs/Self Care Home Management;Cryotherapy;Electrical Stimulation;Moist Heat;Dry needling;Manual techniques;Therapeutic exercise;Neuromuscular re-education;Therapeutic activities;Functional mobility training;Patient/family education;Passive range of motion;Traction;Taping;Joint Manipulations;Spinal Manipulations    PT Next Visit Plan assess response to dry needling, repeat if helpful.  1 more session probable- pt will have a procedure and then will be out of the country.    PT Home Exercise Plan Access Code: [email or web address]    Recommended Other Services initial cert is signed    Consulted and Agree with Plan of Care Patient           Patient will benefit from skilled therapeutic intervention in order to improve the following deficits and impairments:  Decreased range of motion,Postural dysfunction,Decreased strength,Impaired flexibility,Decreased activity tolerance,Pain,Increased muscle spasms,Improper body mechanics,Hypomobility  Visit Diagnosis: Pain in left hip  Chronic left-sided low back pain without sciatica  Muscle weakness (generalized)     Problem List Patient Active Problem List   Diagnosis Date Noted  . Endometriosis 07/29/2017  . 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 Female infertility, secondary 01/02/2014    Lorrene Reid, PT 04/09/20 2:46 PM   [facility] [address], [address] [place], [place], [address] Phone: [phone]   Fax:  [phone]  Name: Lakynn Halvorsen MRN: [id number] Date of Birth: 06-Dec-1975

## 2020-04-11 ENCOUNTER — Ambulatory Visit: Payer: 59

## 2020-04-11 ENCOUNTER — Other Ambulatory Visit: Payer: Self-pay

## 2020-04-11 DIAGNOSIS — M25552 Pain in left hip: Secondary | ICD-10-CM | POA: Diagnosis not present

## 2020-04-11 DIAGNOSIS — M6281 Muscle weakness (generalized): Secondary | ICD-10-CM

## 2020-04-11 DIAGNOSIS — M545 Low back pain, unspecified: Secondary | ICD-10-CM

## 2020-04-11 DIAGNOSIS — G8929 Other chronic pain: Secondary | ICD-10-CM

## 2020-04-11 NOTE — Therapy (Signed)
[facility] [address] [place], [place], [address] Phone: [phone]   Fax:  [phone]  Physical Therapy Treatment  Patient Details  Name: [name] Mccarthy MRN: [id number] Date of Birth: 07/15/1975 Referring Provider (PT): Eunice Blase, MD   Encounter Date: 04/11/2020   PT End of Session - 04/11/20 1309    Visit Number 10    Authorization - Visit Number 10    PT Start Time 3825    PT Stop Time 1311    PT Time Calculation (min) 40 min    Activity Tolerance Patient tolerated treatment well    Behavior During Therapy [facility] for tasks assessed/performed           Past Medical History:  Diagnosis Date  . Androgenic alopecia   . 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 Dental crowns present   . Family history of adverse reaction to anesthesia    pt's mother had problem with anesthesia 32 yrs. ago in [place] - specifics unknown by pt.  . 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 MRI right x 2, left x1.  RIGHT ACL repair (Dr. Novella Olive) 2011.  MRI after repair: 03/2009:1. ACL repair noted with intact graft. 2. 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 3. Trace knee effusion. 4. Low-level subcutaneous edema anterior to the patellar tendon and tibial tubercl    Past Surgical History:  Procedure Laterality Date  . CESAREAN SECTION  09/03/2002  . CHROMOPERTUBATION  08/20/2001  . DIAGNOSTIC LAPAROSCOPY  08/20/2001  . KNEE ARTHROSCOPY Bilateral 2011   Rt ACL repair by Dr. Norm Salt  . KNEE ARTHROSCOPY WITH SUBCHONDROPLASTY Right 03/13/2017   Procedure: KNEE ARTHROSCOPY WITH SUBCHONDROPLASTY;  Surgeon: Melrose Nakayama, MD;  Location: [facility];  Service: Orthopedics;  Laterality: Right;    There were no vitals filed for this visit.   Subjective Assessment - 04/11/20 1235    Subjective I felt better after  last session.    Currently in Pain? Yes    Pain Score 5     Pain Location Buttocks    Pain Orientation Right;Left;Lower    Pain Descriptors / Indicators Aching;Shooting    Pain Type Chronic pain    Pain Onset More than a month ago    Pain Frequency Intermittent    Aggravating Factors  sitting, walking    Pain Relieving Factors electrical stimulation, walking, exercise                             [facility] Adult PT Treatment/Exercise - 04/11/20 0001      Lumbar Exercises: Stretches   Active Hamstring Stretch Left;Right;3 reps;20 seconds    Active Hamstring Stretch Limitations seated    Figure 4 Stretch 2 reps;20 seconds      Lumbar Exercises: Aerobic   Nustep Level 2x 10 minutes-PT present to discuss progress   PT present to discuss progress     Manual Therapy   Manual Therapy Soft tissue mobilization;Myofascial release    Manual therapy comments elongation to Lt gluteals and bil lumbar paraspinals            Trigger Point Dry Needling - 04/11/20 0001    Consent Given? Yes    Education Handout Provided Previously provided    Muscles Treated Back/Hip Gluteus minimus;Gluteus medius;Piriformis;Lumbar multifidi   Rt gluteals, bil multifidi   Gluteus  Minimus Response Twitch response elicited;Palpable increased muscle length    Gluteus Medius Response Twitch response elicited;Palpable increased muscle length    Piriformis Response Twitch response elicited;Palpable increased muscle length    Lumbar multifidi Response Twitch response elicited;Palpable increased muscle length                  PT Short Term Goals - 04/02/20 1453      PT SHORT TERM GOAL #1   Title Pt will be ind with initial HEP    Status Achieved      PT SHORT TERM GOAL #2   Title Pt will report improved sleep by at least 20%    Status Achieved      PT SHORT TERM GOAL #3   Title Pt will demo LE flexibility to [facility] to reduce undue strain on hip, pelvis and spine    Baseline Lt>Rt hip  stiffness    Status On-going      PT SHORT TERM GOAL #4   Title Pt will be able to demo proper standing posture and deep core canister activation in standing.    Status Achieved             PT Long Term Goals - 04/11/20 1243      PT LONG TERM GOAL #1   Title Pt will be ind with advanced HEP    Status Achieved      PT LONG TERM GOAL #2   Title Pt will be able to walk at least 2 miles with min pain to return to daily walking program.    Baseline 2 miles each day    Status Achieved      PT LONG TERM GOAL #3   Title Pt will improve functional strength for stairs by reporting min pain with apartment access  x14 stairs.    Status Achieved      PT LONG TERM GOAL #4   Title Pt will be able to sleep in varying positions and report improved sleep by at least 70%    Baseline 30-40% better    Status On-going      PT LONG TERM GOAL #5   Title Pt will demo improved core strength by maintaining good posture and form with dynamic UE and LE functional challenges such as squat, lift, and carrying 10lb box    Status Achieved                 Plan - 04/11/20 1246    Clinical Impression Statement Pt will D/C today to HEP due to having a medical procedure and then travel out of the country.  Pt reports 30-40% overall improvement in symptoms since the start of care.  Pt is now walking 2 miles with her dog and is able to negotiate steps to her apartment without increased pain.   Pt with tension and palpable tenderness over bil lumbar paraspinals and trigger points in Lt proximal gluteals.  Pt had good response to needling with multiple twitch responses.  Pt had improved mobility and reduced palpable tenderness over lumbar spine and Lt gluteals. Pt will D/C to HEP today.    PT Next Visit Plan D/C PT to HEP    PT Home Exercise Plan Access Code: [email or web address]    Consulted and Agree with Plan of Care Patient           Patient will benefit from skilled therapeutic intervention in order to improve  the following deficits and impairments:     Visit  Diagnosis: Chronic left-sided low back pain without sciatica  Muscle weakness (generalized)  Pain in left hip     Problem List Patient Active Problem List   Diagnosis Date Noted  . Endometriosis 07/29/2017  . 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 Female infertility, secondary 01/02/2014     PHYSICAL THERAPY DISCHARGE SUMMARY  Visits from Start of Care: 10  Current functional level related to goals / functional outcomes: See above for current status.  Pt is having a medical procedure and is going to be out of the country.  Pt has HEP and will work on this.    Remaining deficits: See above.    Education / Equipment: HEP Plan: Patient agrees to discharge.  Patient goals were partially met. Patient is being discharged due to the patient's request.  ?????  Sigurd Sos, PT 04/11/20 1:15 PM       [facility] [address] [place], [place], [address] Phone: [phone]   Fax:  [phone]  Name: Guliana Weyandt MRN: [id number] Date of Birth: 1975-06-17

## 2020-08-08 ENCOUNTER — Other Ambulatory Visit: Payer: Self-pay

## 2020-08-08 ENCOUNTER — Encounter: Payer: Self-pay | Admitting: Family Medicine

## 2020-08-08 ENCOUNTER — Ambulatory Visit (INDEPENDENT_AMBULATORY_CARE_PROVIDER_SITE_OTHER): Payer: 59 | Admitting: Family Medicine

## 2020-08-08 DIAGNOSIS — M5442 Lumbago with sciatica, left side: Secondary | ICD-10-CM

## 2020-08-08 DIAGNOSIS — G8929 Other chronic pain: Secondary | ICD-10-CM | POA: Diagnosis not present

## 2020-08-08 DIAGNOSIS — M25552 Pain in left hip: Secondary | ICD-10-CM

## 2020-08-08 NOTE — Progress Notes (Signed)
I saw and examined the patient with Dr. Marga Hoots and agree with assessment and plan as outlined.    Ongoing left posterior and lateral hip pain.  Felt best after what was either L4-5 facet injection or left SI injection done at [facility] (I can't find the documentation of it), but relief was only for 3 weeks.  Pain went from 10/10 to 3/10 during anesthetic phase, then down to 0/10 for 3 weeks, then came back.  Left greater troch pain as well, but mostly posterior hip.  Will refer to Dr. Alvester Morin for consideration of repeat injections, possible RF.

## 2020-08-08 NOTE — Progress Notes (Signed)
Office Visit Note   Patient: [name] Mccarthy           Date of Birth: Dec 04, 1975           MRN: [id number] Visit Date: 08/08/2020 Requested by: Sherren Mocha, MD [address] [place],  [place] [address] PCP: Sherren Mocha, MD  Subjective: Chief Complaint  Patient presents with  . Left Hip - Pain    Pain in the hip has been worsening since she finished PT. She has difficulty with walking and sleeping due to the pain -- in the posterior hip.    HPI: 45yo F presenting to clinic with ongoing left Hip/Lower Back/SI Joint pain. Patient last seen in Oct 2021, and underwent SI Joint injection as well as GTB injection at that time. She states neither of these offered significant improvement, and MRI was ordered- which revealed mild hip DJD as well as L4-L5 facet arthropathy. Patient was sent to PT, which she states offered very temporary improvement.  She has previously undergone Facet injections/SI Injection with [facility], which she said offered profound relief for approximately 3weeks. "I felt like a whole new person!" she was unhappy the relief was so short lived, and wants to know if there is anything more she can try. No radicular symptoms, no bowel/bladder dysfunction.               ROS:   All other systems were reviewed and are negative.  Objective: Vital Signs: There were no vitals taken for this visit.  Physical Exam:  General:  Alert and oriented, in no acute distress. Pulm:  Breathing unlabored. Psy:  Normal mood, congruent affect. Skin:  Lower back with no bruising, rashes, or erythema.    BACK EXAMINATION: Normal Gait.  Normal Spinal curvature, without excessive lumbar lordosis, thoracic kyphosis, or scoliosis.  ROM: No Pain with forward flexion. Able to achieve Toe-Touch. Endorses pain with extension, which is significantly worsened with leftward rotation.  Hip ROM intact, with symmetrical internal and external rotation. Does endorse some groin pain with extremes of internal  rotation.   Palpation: Endorses significant pain with palpation of left SI Joint, as well as left paraspinal muscles from L4 extending inferiorly into glutes. Significant pain with palpation of left greater trochanteric area.   Strength: Hip flexion (L1), Hip Aduction (L2), Knee Extension (L3) are 5/5 Bilaterally Foot Inversion (L4), Dorsiflexion (L5), and Eversion (S1) 5/5 Bilaterally Sensation: Intact to light touch medial and lateral aspects of lower extremities, and lateral, dorsal, and medial aspects of foot.   Special Tests:  FABER causes significant lower back/SI Pain Seated Slump: No radiation down Ipilateral or contralateral leg bilaterally   Imaging: CLINICAL DATA:  Initial evaluation for chronic and persistent left lower back pain with left hip pain.  EXAM: MRI LUMBAR SPINE WITHOUT CONTRAST  TECHNIQUE: Multiplanar, multisequence MR imaging of the lumbar spine was performed. No intravenous contrast was administered.  COMPARISON:  Previous MRI from 11/10/2018.  FINDINGS: Segmentation: Standard. Lowest well-formed disc space labeled the L5-S1 level.  Alignment: Physiologic with preservation of the normal lumbar lordosis. No listhesis.  Vertebrae: Vertebral body height maintained without acute or chronic fracture. Bone marrow signal intensity within normal limits. Few scattered benign hemangiomata noted. No worrisome osseous lesions. No abnormal marrow edema.  Conus medullaris and cauda equina: Conus extends to the L1 level. Conus and cauda equina appear normal. 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  Paraspinal and other soft tissues: Unremarkable.  Disc levels:  T10-11: Unremarkable.  T11-12: Seen only on sagittal projection. Small central disc protrusion indents the ventral thecal sac (series 5, image 7). Resultant mild spinal stenosis with minimal indentation of the ventral spinal cord. Foramina remain patent.  T12-L1:  Unremarkable.  L1-2: Minimal disc desiccation without disc bulge. No stenosis or impingement.  L2-3: Minimal disc desiccation without disc bulge. No stenosis or impingement.  L3-4: Minimal disc desiccation without significant disc bulge. No stenosis or impingement.  L4-5: Minimal disc desiccation without significant disc bulge. Subtle annular fissure at the level of the left neural foramen (series 5, image 9). Associated minimal endplate spurring. Minimal facet hypertrophy on the left. Resultant mild left foraminal and left lateral recess stenosis without overt neural impingement. Central canal remains widely patent. Right neural foramina remains widely patent.  L5-S1:  Unremarkable.  IMPRESSION: 1. Subtle annular fissure at the level of the left neural foramen with mild left-sided facet hypertrophy at L4-5, resulting in mild left foraminal and left lateral recess stenosis. No frank neural impingement. 2. Small central disc protrusion at T11-12 with resultant mild spinal stenosis.   Electronically Signed   By: Rise Mu M.D.   On: 01/01/2020 20:26   Assessment & Plan: 45yo F presenting to clinic with ongoing left lower back/hip/GTB pain despite multiple injection attempts. Patient does state that she had near complete relief following SI/Facet injections performed at [facility] (Record unavailable). Given this, she may be a good candidate for RFA to this area. Will place referral to Dr Alvester Morin to discuss. Patient agrees with plan.      Procedures: No procedures performed        PMFS History: Patient Active Problem List   Diagnosis Date Noted  . Endometriosis 07/29/2017  . 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 Androgenic alopecia   . 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 Dental crowns present   . Family history of adverse reaction to anesthesia     pt's mother had problem with anesthesia 30 yrs. ago in [place] - specifics unknown by pt.  . 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 MRI right x 2, left x1.  RIGHT ACL repair (Dr. Margreta Journey) 2011.  MRI after repair: 03/2009:1. ACL repair noted with intact graft. 2. 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 3. Trace knee effusion. 4. Low-level subcutaneous edema anterior to the patellar tendon and tibial tubercl    Family History  Problem Relation Age of Onset  . Arthritis Mother        osteo  . Anesthesia problems Mother        details unknown by pt.  . Hypertension Mother   . Hypertension Father     Past Surgical History:  Procedure Laterality Date  . CESAREAN SECTION  09/03/2002  . CHROMOPERTUBATION  08/20/2001  . DIAGNOSTIC LAPAROSCOPY  08/20/2001  . KNEE ARTHROSCOPY Bilateral 2011   Rt ACL repair by Dr. Fara Chute  . KNEE ARTHROSCOPY WITH SUBCHONDROPLASTY Right 03/13/2017   Procedure: KNEE ARTHROSCOPY WITH SUBCHONDROPLASTY;  Surgeon: Marcene Corning, MD;  Location: [facility];  Service: Orthopedics;  Laterality: Right;   Social History   Occupational History  . Occupation: Paralegal  Tobacco Use  . Smoking status: Former Smoker    Quit date: 03/03/2007    Years since quitting: 13.4  . Smokeless tobacco: Never Used  Vaping Use  . Vaping Use: Never used  Substance and Sexual Activity  . Alcohol use: No  . Drug use: No  . Sexual activity: Not on file

## 2020-08-29 ENCOUNTER — Telehealth: Payer: Self-pay | Admitting: Physical Medicine and Rehabilitation

## 2020-08-29 ENCOUNTER — Other Ambulatory Visit: Payer: Self-pay

## 2020-08-29 ENCOUNTER — Ambulatory Visit (INDEPENDENT_AMBULATORY_CARE_PROVIDER_SITE_OTHER): Payer: 59 | Admitting: Physical Medicine and Rehabilitation

## 2020-08-29 ENCOUNTER — Encounter: Payer: Self-pay | Admitting: Physical Medicine and Rehabilitation

## 2020-08-29 VITALS — BP 123/85 | HR 76

## 2020-08-29 DIAGNOSIS — G894 Chronic pain syndrome: Secondary | ICD-10-CM

## 2020-08-29 DIAGNOSIS — G8929 Other chronic pain: Secondary | ICD-10-CM

## 2020-08-29 DIAGNOSIS — M25552 Pain in left hip: Secondary | ICD-10-CM

## 2020-08-29 DIAGNOSIS — M5416 Radiculopathy, lumbar region: Secondary | ICD-10-CM | POA: Diagnosis not present

## 2020-08-29 DIAGNOSIS — M5442 Lumbago with sciatica, left side: Secondary | ICD-10-CM

## 2020-08-29 NOTE — Progress Notes (Signed)
Left posterior and lateral hip pain. Worse going up stairs and with walking. Numeric Pain Rating Scale and Functional Assessment Average Pain 7 Pain Right Now 7 My pain is constant, dull, and aching Pain is worse with: walking and some activites Pain improves with:  nothing   In the last MONTH (on 0-10 scale) has pain interfered with the following?  1. General activity like being  able to carry out your everyday physical activities such as walking, climbing stairs, carrying groceries, or moving a chair?  Rating(10)  2. Relation with others like being able to carry out your usual social activities and roles such as  activities at home, at work and in your community. Rating(10)  3. Enjoyment of life such that you have  been bothered by emotional problems such as feeling anxious, depressed or irritable?  Rating(10)

## 2020-08-29 NOTE — Telephone Encounter (Signed)
Needs auth and scheduling for **Left L4 TF.

## 2020-08-29 NOTE — Progress Notes (Signed)
[name] Mccarthy - 45 y.o. female MRN [id number]  Date of birth: 02-Sep-1975  Office Visit Note: Visit Date: 08/29/2020 PCP: Sherren Mocha, MD Referred by: Sherren Mocha, MD  Subjective: Chief Complaint  Patient presents with   Left Hip - Pain   HPI: [name] Mccarthy is a 45 y.o. female who comes in today at the request of Dr. Lavada Mesi for evaluation of chronic, worsening, and severe left lower back pain radiating to buttock, hip and leg.  He felt like the patient may be a candidate for radiofrequency ablation of the lower lumbar facet joints or sacroiliac joint.  Patient reports pain has been a chronic issue for many years and has also been evaluated and treated at [facility]. Patient reports worsening pain with prolonged sitting, walking and activity. Patient rates pain 10 out 10 at present and describes as sharp sensation. Patient reports some pain relief with the use of stretching exercises, ibuprofen and ice. Patients lumbar MRI from 2021 exhibits annular fissure and left lateral recess narrowing at L4-L5. Patient was seen by Dr. Lavada Mesi in 2021 and had both left SI joint and GTB injections performed with minimal unsustained relief.  She reports injections at [facility] did seem to give her some relief at times but it was temporary.  We reviewed all those notes and it seemed like she was getting sacroiliac joint injections.  She has not had facet joint blocks or epidural injection.  Patient reports she has been to physical therapy recently at [facility] and did get some relief of pain however it was very short lived. Patient reports she is in excruciating pain all day and has difficulty working and performing tasks at home. Patient denies focal weakness, numbness and tingling. Patient denies recent trauma or falls.   Review of Systems  Musculoskeletal:  Positive for back pain and joint pain.  Neurological:  Negative for tingling,  sensory change and focal weakness.  All other systems reviewed and are negative. Otherwise per HPI.  Assessment & Plan: Visit Diagnoses:    ICD-10-CM   1. Lumbar radiculopathy  M54.16     2. Chronic left-sided low back pain with left-sided sciatica  M54.42    G89.29     3. Pain in left hip  M25.552     4. Chronic pain syndrome  G89.4        Plan: Findings:  Chronic, worsening, and severe left lower back pain radiating to buttock with occasional hip and leg. Patient's clinical picture and imaging is consistent radicular pain likely L5 versus L4. Patient continues conservative therapy such as stretching, use of medications and ice at home with minimal relief. Patient's lumbar MRI reviewed with her today in the office using imaging and spinal models. We believe the next step in treatment for this patient would be a diagnostic and hopefully therapeutic left L4-L5 transforaminal epidural steroid injection.  Depending on relief would consider would consider diagnostic would consider diagnostic   Meds & Orders: No orders of the defined types were placed in this encounter.  No orders of the defined types were placed in this encounter.   Follow-up: Return in about 1 week (around 09/05/2020) for left L4-L5 transforaminal ESI.   Procedures: No procedures performed      Clinical History: CLINICAL DATA:  Low back and left hip pain for 6-7 months. No acute  injury. Limited relief from left trochanteric injection 2.5 months  ago.   EXAM:  MRI LUMBAR SPINE WITHOUT CONTRAST   TECHNIQUE:  Multiplanar, multisequence MR imaging of the lumbar spine was  performed. No intravenous contrast was administered.   COMPARISON:  Abdominopelvic CT 12/25/2014.   FINDINGS:  Segmentation: Conventional anatomy assumed, with the last open disc  space designated L5-S1.Concordant with previous imaging.   Alignment:  Normal.   Vertebrae: No worrisome osseous lesion, acute fracture or pars  defect. The  visualized sacroiliac joints appear unremarkable.   Conus medullaris: Extends to the L1 level and appears normal. There  is a small right-sided Tarlov cyst at the S1 level.   Paraspinal and other soft tissues: No significant paraspinal  findings.   Disc levels:   All of the discs are well hydrated with maintained height. There is  no disc herniation, spinal stenosis or nerve root encroachment.   IMPRESSION:  Small right-sided sacral Tarlov cyst, likely incidental. Otherwise  normal MRI of the lumbar spine.    Electronically Signed    By: Carey Bullocks M.D.    On: 11/10/2018 14:46   She reports that she quit smoking about 13 years ago. She has never used smokeless tobacco. No results for input(s): HGBA1C, LABURIC in the last 8760 hours.  Objective:  VS:  HT:    WT:   BMI:     BP:123/85  HR:76bpm  TEMP: ( )  RESP:  Physical Exam HENT:     Head: Normocephalic and atraumatic.     Right Ear: Tympanic membrane normal.     Left Ear: Tympanic membrane normal.     Nose: Nose normal.     Mouth/Throat:     Mouth: Mucous membranes are moist.  Eyes:     Pupils: Pupils are equal, round, and reactive to light.  Cardiovascular:     Rate and Rhythm: Normal rate.     Pulses: Normal pulses.  Pulmonary:     Effort: Pulmonary effort is normal.  Abdominal:     General: There is no distension.  Musculoskeletal:     Cervical back: Normal range of motion and neck supple.     Right lower leg: No edema.     Left lower leg: No edema.     Comments: Pt rises from seated position to standing without difficulty. Good lumbar range of motion. Strong distal strength without clonus, no pain upon palpation of greater trochanters. Sensation intact bilaterally. Walks independently, gait steady. Negative Patrick's test.   Skin:    General: Skin is warm and dry.     Capillary Refill: Capillary refill takes less than 2 seconds.     Findings: No rash.  Neurological:     General: No focal deficit  present.     Mental Status: She is alert.     Sensory: No sensory deficit.     Motor: No weakness.     Gait: Gait normal.  Psychiatric:        Mood and Affect: Mood normal.    Ortho Exam  Imaging: No results found.  Past Medical/Family/Surgical/Social History: Medications & Allergies reviewed per EMR, new medications updated. 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 ago in  [place] - specifics unknown by pt.   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 MRI right x 2, left x1.  RIGHT ACL repair (Dr. Margreta Journey) 2011.  MRI after repair: 03/2009:1.  ACL repair noted with intact graft. 2.  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 3.  Trace knee effusion. 4.  Low-level subcutaneous edema anterior to the patellar tendon and tibial tubercl   Family History  Problem Relation Age of Onset   Arthritis Mother        osteo   Anesthesia problems Mother        details unknown by pt.   Hypertension Mother    Hypertension Father    Past Surgical History:  Procedure Laterality Date   CESAREAN SECTION  09/03/2002   CHROMOPERTUBATION  08/20/2001   DIAGNOSTIC LAPAROSCOPY  08/20/2001   KNEE ARTHROSCOPY Bilateral 2011   Rt ACL repair by Dr. Fara Chute   KNEE ARTHROSCOPY WITH SUBCHONDROPLASTY Right 03/13/2017   Procedure: KNEE ARTHROSCOPY WITH SUBCHONDROPLASTY;  Surgeon: Marcene Corning, MD;  Location: [facility];  Service: Orthopedics;  Laterality: Right;   Social History   Occupational History   Occupation: Paralegal  Tobacco  Use   Smoking status: Former    Pack years: 0.00    Types: Cigarettes    Quit date: 03/03/2007    Years since quitting: 13.5   Smokeless tobacco: Never  Vaping Use   Vaping Use: Never used  Substance and Sexual Activity   Alcohol use: No   Drug use: No   Sexual activity: Not on file

## 2020-08-30 ENCOUNTER — Encounter: Payer: Self-pay | Admitting: Physical Medicine and Rehabilitation

## 2020-09-04 ENCOUNTER — Telehealth: Payer: Self-pay

## 2020-09-04 NOTE — Telephone Encounter (Signed)
Patient called she stated she has a new problem and is requesting a call [phone]

## 2020-09-05 ENCOUNTER — Telehealth: Payer: Self-pay

## 2020-09-05 NOTE — Telephone Encounter (Signed)
Holding for [employer]

## 2020-09-05 NOTE — Telephone Encounter (Signed)
I called patient. She states that she has been having urine leakage and did not tell the physician. She was seen by her gynecologist today who did tests on her bladder, including ultrasound, and states that they felt this is due to her back problem. Patient wants to be sure that you are aware of this symptom in case it changes her treatment plan. She is awaiting authorization and scheduling of ESI.   I did advise Dr. Alvester Morin is out of the office this week and that I would send message to [facility].  Patient asked that I send message to Dr. Alvester Morin as well.

## 2020-09-05 NOTE — Telephone Encounter (Signed)
Patient called she is requesting a appointment with Dr.Newton for a injection call back:[phone]

## 2020-09-10 NOTE — Telephone Encounter (Signed)
Called patient to advise. I let her know that we would call to scheduled when this has been authorized.

## 2020-09-12 ENCOUNTER — Telehealth: Payer: Self-pay

## 2020-09-12 NOTE — Telephone Encounter (Signed)
Attempt made to contact [name] Mccarthy is a 45 y.o. female re: New Patient appt with Dr. Florian Buff. Pt was not available.  No VM available to leave a message.Marland Kitchen

## 2020-09-18 ENCOUNTER — Telehealth: Payer: Self-pay

## 2020-09-18 NOTE — Telephone Encounter (Signed)
Pt returned shena's call

## 2020-10-04 ENCOUNTER — Ambulatory Visit (INDEPENDENT_AMBULATORY_CARE_PROVIDER_SITE_OTHER): Payer: 59 | Admitting: Physical Medicine and Rehabilitation

## 2020-10-04 ENCOUNTER — Encounter: Payer: Self-pay | Admitting: Physical Medicine and Rehabilitation

## 2020-10-04 ENCOUNTER — Other Ambulatory Visit: Payer: Self-pay

## 2020-10-04 ENCOUNTER — Ambulatory Visit: Payer: Self-pay

## 2020-10-04 VITALS — BP 127/79 | HR 83

## 2020-10-04 DIAGNOSIS — M5416 Radiculopathy, lumbar region: Secondary | ICD-10-CM

## 2020-10-04 MED ORDER — METHYLPREDNISOLONE ACETATE 80 MG/ML IJ SUSP
80.0000 mg | Freq: Once | INTRAMUSCULAR | Status: AC
  Administered 2020-10-04: 80 mg

## 2020-10-04 NOTE — Progress Notes (Signed)
Pt state lower back pain that travels to her left hip. Pt state walking, standing and laying down makes the pain worse. Pt state she takes over the counter pain meds and uses heat and ice to help ease her pain  Numeric Pain Rating Scale and Functional Assessment Average Pain 7   In the last MONTH (on 0-10 scale) has pain interfered with the following?  1. General activity like being  able to carry out your everyday physical activities such as walking, climbing stairs, carrying groceries, or moving a chair?  Rating(10)   +Driver, -BT, -Dye Allergies.

## 2020-10-04 NOTE — Patient Instructions (Signed)

## 2020-10-04 NOTE — Progress Notes (Signed)
[name] Mccarthy - 45 y.o. female MRN [id number]  Date of birth: 03-Oct-1975  Office Visit Note: Visit Date: 10/04/2020 PCP: Sherren Mocha, MD Referred by: Sherren Mocha, MD  Subjective: Chief Complaint  Patient presents with   Lower Back - Pain   Left Hip - Pain   HPI:  [name] Mccarthy is a 45 y.o. female who comes in today for planned Left L4-L5 Lumbar Transforaminal epidural steroid injection with fluoroscopic guidance.  The patient has failed conservative care including home exercise, medications, time and activity modification.  This injection will be diagnostic and hopefully therapeutic.  Please see requesting physician notes for further details and justification. MRI reviewed with images and spine model.  MRI reviewed in the note below.     ROS Otherwise per HPI.  Assessment & Plan: Visit Diagnoses:    ICD-10-CM   1. Lumbar radiculopathy  M54.16 XR C-ARM NO REPORT    Epidural Steroid injection    methylPREDNISolone acetate (DEPO-MEDROL) injection 80 mg      Plan: No additional findings.   Meds & Orders:  Meds ordered this encounter  Medications   methylPREDNISolone acetate (DEPO-MEDROL) injection 80 mg    Orders Placed This Encounter  Procedures   XR C-ARM NO REPORT   Epidural Steroid injection    Follow-up: Return if symptoms worsen or fail to improve.   Procedures: No procedures performed  Lumbosacral Transforaminal Epidural Steroid Injection - Sub-Pedicular Approach with Fluoroscopic Guidance  Patient: [name] Mccarthy      Date of Birth: Jan 13, 1976 MRN: [id number] PCP: Sherren Mocha, MD      Visit Date: 10/04/2020   Universal Protocol:    Date/Time: 10/04/2020  Consent Given By: the patient  Position: PRONE  Additional Comments: Vital signs were monitored before and after the procedure. Patient was prepped and draped in the usual sterile fashion. The correct patient, procedure, and site was verified.   Injection Procedure Details:   Procedure  diagnoses: Lumbar radiculopathy [M54.16]    Meds Administered:  Meds ordered this encounter  Medications   methylPREDNISolone acetate (DEPO-MEDROL) injection 80 mg    Laterality: Left  Location/Site:  L4-L5  Needle:5.0 in., 22 ga.  Short bevel or Quincke spinal needle  Needle Placement: Transforaminal  Findings:    -Comments: Excellent flow of contrast along the nerve, nerve root and into the epidural space.  Procedure Details: After squaring off the end-plates to get a true AP view, the C-arm was positioned so that an oblique view of the foramen as noted above was visualized. The target area is just inferior to the "nose of the scotty dog" or sub pedicular. The soft tissues overlying this structure were infiltrated with 2-3 ml. of 1% Lidocaine without Epinephrine.  The spinal needle was inserted toward the target using a "trajectory" view along the fluoroscope beam.  Under AP and lateral visualization, the needle was advanced so it did not puncture dura and was located close the 6 O'Clock position of the pedical in AP tracterory. Biplanar projections were used to confirm position. Aspiration was confirmed to be negative for CSF and/or blood. A 1-2 ml. volume of Isovue-250 was injected and flow of contrast was noted at each level. Radiographs were obtained for documentation purposes.   After attaining the desired flow of contrast documented above, a 0.5 to 1.0 ml test dose of 0.25% Marcaine was injected into each respective transforaminal space.  The patient was observed for 90 seconds post injection.  After no sensory deficits were reported,  and normal lower extremity motor function was noted,   the above injectate was administered so that equal amounts of the injectate were placed at each foramen (level) into the transforaminal epidural space.   Additional Comments:  The patient tolerated the procedure well Dressing: 2 x 2 sterile gauze and Band-Aid    Post-procedure  details: Patient was observed during the procedure. Post-procedure instructions were reviewed.  Patient left the clinic in stable condition.    Clinical History: MRI LUMBAR SPINE WITHOUT CONTRAST   TECHNIQUE: Multiplanar, multisequence MR imaging of the lumbar spine was performed. No intravenous contrast was administered.   COMPARISON:  Previous MRI from 11/10/2018.   FINDINGS: Segmentation: Standard. Lowest well-formed disc space labeled the L5-S1 level.   Alignment: Physiologic with preservation of the normal lumbar lordosis. No listhesis.   Vertebrae: Vertebral body height maintained without acute or chronic fracture. Bone marrow signal intensity within normal limits. Few scattered benign hemangiomata noted. No worrisome osseous lesions. No abnormal marrow edema.   Conus medullaris and cauda equina: Conus extends to the L1 level. Conus and cauda equina appear normal. 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   Paraspinal and other soft tissues: Unremarkable.   Disc levels:   T10-11: Unremarkable.   T11-12: Seen only on sagittal projection. Small central disc protrusion indents the ventral thecal sac (series 5, image 7). Resultant mild spinal stenosis with minimal indentation of the ventral spinal cord. Foramina remain patent.   T12-L1: Unremarkable.   L1-2: Minimal disc desiccation without disc bulge. No stenosis or impingement.   L2-3: Minimal disc desiccation without disc bulge. No stenosis or impingement.   L3-4: Minimal disc desiccation without significant disc bulge. No stenosis or impingement.   L4-5: Minimal disc desiccation without significant disc bulge. Subtle annular fissure at the level of the left neural foramen (series 5, image 9). Associated minimal endplate spurring. Minimal facet hypertrophy on the left. Resultant mild left foraminal and left lateral recess stenosis without overt neural impingement. Central canal remains  widely patent. Right neural foramina remains widely patent.   L5-S1:  Unremarkable.   IMPRESSION: 1. Subtle annular fissure at the level of the left neural foramen with mild left-sided facet hypertrophy at L4-5, resulting in mild left foraminal and left lateral recess stenosis. No frank neural impingement. 2. Small central disc protrusion at T11-12 with resultant mild spinal stenosis.     Electronically Signed   By: Rise Mu M.D.   On: 01/01/2020 20:26     Objective:  VS:  HT:    WT:   BMI:     BP:127/79  HR:83bpm  TEMP: ( )  RESP:  Physical Exam Vitals and nursing note reviewed.  Constitutional:      General: She is not in acute distress.    Appearance: Normal appearance. She is not ill-appearing.  HENT:     Head: Normocephalic and atraumatic.     Right Ear: External ear normal.     Left Ear: External ear normal.  Eyes:     Extraocular Movements: Extraocular movements intact.  Cardiovascular:     Rate and Rhythm: Normal rate.     Pulses: Normal pulses.  Pulmonary:     Effort: Pulmonary effort is normal. No respiratory distress.  Abdominal:     General: There is no distension.     Palpations: Abdomen is soft.  Musculoskeletal:        General: Tenderness present.     Cervical back: Neck supple.  Right lower leg: No edema.     Left lower leg: No edema.     Comments: Patient has good distal strength with no pain over the greater trochanters.  No clonus or focal weakness.  Skin:    Findings: No erythema, lesion or rash.  Neurological:     General: No focal deficit present.     Mental Status: She is alert and oriented to person, place, and time.     Sensory: No sensory deficit.     Motor: No weakness or abnormal muscle tone.     Coordination: Coordination normal.  Psychiatric:        Mood and Affect: Mood normal.        Behavior: Behavior normal.     Imaging: No results found.

## 2020-10-11 ENCOUNTER — Ambulatory Visit (INDEPENDENT_AMBULATORY_CARE_PROVIDER_SITE_OTHER): Payer: 59 | Admitting: Pulmonary Disease

## 2020-10-11 ENCOUNTER — Encounter: Payer: Self-pay | Admitting: Pulmonary Disease

## 2020-10-11 ENCOUNTER — Ambulatory Visit (INDEPENDENT_AMBULATORY_CARE_PROVIDER_SITE_OTHER): Payer: 59

## 2020-10-11 ENCOUNTER — Other Ambulatory Visit: Payer: Self-pay

## 2020-10-11 ENCOUNTER — Other Ambulatory Visit: Payer: Self-pay | Admitting: Pulmonary Disease

## 2020-10-11 VITALS — BP 120/66 | HR 66 | Temp 98.1°F | Ht 61.0 in | Wt 198.4 lb

## 2020-10-11 DIAGNOSIS — U099 Post covid-19 condition, unspecified: Secondary | ICD-10-CM

## 2020-10-11 DIAGNOSIS — R053 Chronic cough: Secondary | ICD-10-CM

## 2020-10-11 MED ORDER — ADVAIR HFA 45-21 MCG/ACT IN AERO: 2.0000 | INHALATION_SPRAY | Freq: Two times a day (BID) | RESPIRATORY_TRACT | 6 refills | Status: DC

## 2020-10-11 MED ORDER — BUDESONIDE-FORMOTEROL FUMARATE 80-4.5 MCG/ACT IN AERO: 2.0000 | INHALATION_SPRAY | Freq: Two times a day (BID) | RESPIRATORY_TRACT | 6 refills | Status: DC

## 2020-10-11 MED ORDER — BUDESONIDE-FORMOTEROL FUMARATE 160-4.5 MCG/ACT IN AERO: 2.0000 | INHALATION_SPRAY | Freq: Two times a day (BID) | RESPIRATORY_TRACT | 6 refills | Status: DC

## 2020-10-11 NOTE — Progress Notes (Signed)
Subjective:   PATIENT ID: [name] Mccarthy GENDER: female DOB: [birth year], MRN: [id number]   HPI  Chief Complaint  Patient presents with   Consult    Post covid June 06, 2020 (while traveling).  Dry cough, sob with exertion.    Reason for Visit: New consult for cough  Ms. Luvena Langwell is a 45 year old female with recent history of COVID-19 in April 2022 who presents as a new patient She reports cough and wheezing for last four months after her COVID-19 URI. She has chronic sinus issues in the last two years. She has been taking montelukast for the last month. She uses flonase twice a day in the last year with partial relief. She has a dry cough. She reports occasional chest congestion. Cough will improve with honey. Will have wheezing nightly and mainly occurs when laying down.  Social History: Negligible smoking history. Social smoker 3 years. Daughter is also my patient  I have personally reviewed patient's past medical/family/social history, allergies, current medications.  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 ago in [place] - specifics unknown by pt.   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 MRI right x 2, left x1.  RIGHT ACL repair (Dr. Margreta Journey) 2011.  MRI after repair: 03/2009:1.  ACL repair noted with intact graft. 2.  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 3.  Trace knee effusion. 4.  Low-level subcutaneous edema anterior to the patellar tendon and tibial tubercl     Family History  Problem Relation Age of Onset   Arthritis Mother        osteo   Anesthesia problems Mother        details unknown by pt.   Hypertension Mother    Hypertension Father      Social History    Occupational History   Occupation: Paralegal  Tobacco Use   Smoking status: Former    Years: 4.00    Types: Cigarettes    Quit date: 03/03/2007    Years since quitting: 13.6   Smokeless tobacco: Never   Tobacco comments:    5-6 cigarettes per day  Vaping Use   Vaping Use: Never used  Substance and Sexual Activity   Alcohol use: No   Drug use: No   Sexual activity: Not on file    Allergies  Allergen Reactions   Hydrocodone Shortness Of Breath, Itching and Palpitations   Oxycodone Shortness Of Breath, Itching and Palpitations     Outpatient Medications Prior to Visit  Medication Sig Dispense Refill   albuterol (VENTOLIN HFA) 108 (90 Base) MCG/ACT inhaler albuterol sulfate HFA 90 mcg/actuation aerosol inhaler  2 PUFFS BY INHALATION EVERY 6 HOURS AS NEEDED     Calcium Carb-Cholecalciferol (CALCIUM-VITAMIN D) 500-200 MG-UNIT tablet Take 1 tablet by mouth daily.     estradiol (CLIMARA - DOSED IN MG/24 HR) 0.1 mg/24hr patch estradiol 0.1 mg/24 hr weekly transdermal patch  Apply 1 patch every week by transdermal route.     fluticasone (FLONASE) 50 MCG/ACT nasal spray Place 1 spray into both nostrils daily.     montelukast (SINGULAIR) 10 MG tablet Take 10 mg by mouth daily.     Multiple Vitamin (MULTIVITAMIN)  tablet Take 1 tablet by mouth daily.     PROGESTERONE PO Take 200 mg by mouth.     Cholecalciferol (VITAMIN D3) 25 MCG (1000 UT) CAPS Take by mouth daily. (Patient not taking: Reported on 10/11/2020)     cetirizine (ZYRTEC) 10 MG chewable tablet Chew 10 mg by mouth daily. (Patient not taking: Reported on 10/11/2020)     nabumetone (RELAFEN) 500 MG tablet Take 1 tablet (500 mg total) by mouth 2 (two) times daily as needed. (Patient not taking: Reported on 10/11/2020) 60 tablet 3   Facility-Administered Medications Prior to Visit  Medication Dose Route Frequency Provider Last Rate Last Admin   methylPREDNISolone acetate (DEPO-MEDROL) injection 80 mg  80 mg Other Once Tyrell Antonio, MD        Review of Systems  Constitutional:  Negative for chills, diaphoresis, fever, malaise/fatigue and weight loss.  HENT:  Positive for congestion. Negative for ear pain and sore throat.   Respiratory:  Positive for cough and wheezing. Negative for hemoptysis, sputum production and shortness of breath.   Cardiovascular:  Negative for chest pain, palpitations and leg swelling.  Gastrointestinal:  Negative for abdominal pain, heartburn and nausea.  Genitourinary:  Negative for frequency.  Musculoskeletal:  Negative for joint pain and myalgias.  Skin:  Negative for itching and rash.  Neurological:  Negative for dizziness, weakness and headaches.  Endo/Heme/Allergies:  Does not bruise/bleed easily.  Psychiatric/Behavioral:  Negative for depression. The patient is not nervous/anxious.     Objective:   Vitals:   10/11/20 1117  BP: 120/66  Pulse: 66  Temp: 98.1 F (36.7 C)  TempSrc: Oral  SpO2: 97%  Weight: 198 lb 6.4 oz (90 kg)  Height: 5\' 1"  (1.549 m)   SpO2: 97 % O2 Device: None (Room air)  Physical Exam: General: Well-appearing, no acute distress HENT: [place], AT Eyes: EOMI, no scleral icterus Respiratory: Clear to auscultation bilaterally.  No crackles, wheezing or rales Cardiovascular: RRR, -M/R/G, no JVD Extremities:-Edema,-tenderness Neuro: AAO x4, CNII-XII grossly intact Psych: Normal mood, normal affect  Data Reviewed:  Imaging: CXR 10/11/20 - No infiltrate, effusion or edema  PFT: None on file  Labs: CBC    Component Value Date/Time   WBC 5.4 01/05/2017 1448   WBC 6.0 11/30/2014 0857   RBC 4.33 01/05/2017 1448   RBC 4.42 11/30/2014 0857   HGB 13.6 01/05/2017 1448   HCT 39.8 01/05/2017 1448   PLT 255 01/05/2017 1448   MCV 92 01/05/2017 1448   MCH 31.4 01/05/2017 1448   MCH 29.9 11/30/2014 0857   MCHC 34.2 01/05/2017 1448   MCHC 32.5 11/30/2014 0857   RDW 13.6 01/05/2017 1448   LYMPHSABS 1.9 01/05/2017 1448   EOSABS 0.1 01/05/2017 1448    BASOSABS 0.0 01/05/2017 1448     Assessment & Plan:   Discussion: 45 year old female with recent history of COVID-19 in April 2022 who presents as a new patient. 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  CT Lung Screen - not qualified  Orders Placed This Encounter  Procedures   DG Chest 2 View    Standing Status:   Future    Number of Occurrences:   1    Standing Expiration Date:  10/11/2021    Order Specific Question:   Reason for Exam (SYMPTOM  OR DIAGNOSIS REQUIRED)    Answer:   post covid cough    Order Specific Question:   Preferred imaging location?    Answer:   Internal    Order Specific Question:   Radiology Contrast Protocol - do NOT remove file path    Answer:   [email or web address]   Pulmonary Function Test    Standing Status:   Future    Standing Expiration Date:   10/11/2021    Order Specific Question:   Where should this test be performed?    Answer:   [facility]    Order Specific Question:   Full PFT: includes the following: basic spirometry, spirometry pre & post bronchodilator, diffusion capacity (DLCO), lung volumes    Answer:   Full PFT    Order Specific Question:   MIP/MEP    Answer:   No    Order Specific Question:   6 minute walk    Answer:   No    Order Specific Question:   ABG    Answer:   No    Order Specific Question:   Diffusion capacity (DLCO)    Answer:   Yes    Order Specific Question:   Lung volumes    Answer:   Yes    Order Specific Question:   Methacholine challenge    Answer:   No   Meds ordered this encounter  Medications   DISCONTD: fluticasone-salmeterol (ADVAIR HFA) 45-21 MCG/ACT inhaler    Sig: Inhale 2 puffs  into the lungs 2 (two) times daily.    Dispense:  1 each    Refill:  6   DISCONTD: budesonide-formoterol (SYMBICORT) 160-4.5 MCG/ACT inhaler    Sig: Inhale 2 puffs into the lungs in the morning and at bedtime.    Dispense:  1 each    Refill:  6   budesonide-formoterol (SYMBICORT) 80-4.5 MCG/ACT inhaler    Sig: Inhale 2 puffs into the lungs in the morning and at bedtime.    Dispense:  1 each    Refill:  6    Return in about 3 months (around 01/11/2021).  I have spent a total time of 45-minutes on the day of the appointment reviewing prior documentation, coordinating care and discussing medical diagnosis and plan with the patient/family. Imaging, labs and tests included in this note have been reviewed and interpreted independently by me.  Onedia Vargus Mechele Collin, MD [facility] 10/11/2020 11:27 AM  Office Number [phone]

## 2020-10-11 NOTE — Patient Instructions (Addendum)
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

## 2020-10-11 NOTE — Telephone Encounter (Signed)
Changes Requested   Name from pharmacy: ADVAIR HFA 45-21 MCG INHALER       Will file in chart as: ADVAIR HFA 45-21 MCG/ACT inhaler    Possible duplicate: Hover to review recent actions on this medication   Sig: INHALE 2 PUFFS INTO THE LUNGS TWICE A DAY   Disp:  8 each    Refills:  6   Start: 10/11/2020   Class: Normal   Last ordered: Today by Luciano Cutter, MD Last refill: 10/11/2020   Rx #: [id number]   Pharmacy comment: Alternative Requested:NOT COVERED ON INSURANCE FORMULARY.     To be filled at: [id number] - [place], [place] - [address]    Dr. Everardo All, please advise on an alternate therapy.

## 2020-10-22 NOTE — Procedures (Signed)
Lumbosacral Transforaminal Epidural Steroid Injection - Sub-Pedicular Approach with Fluoroscopic Guidance  Patient: [name] Mccarthy      Date of Birth: [birth year] MRN: [id number] PCP: Sherren Mocha, MD      Visit Date: 10/04/2020   Universal Protocol:    Date/Time: 10/04/2020  Consent Given By: the patient  Position: PRONE  Additional Comments: Vital signs were monitored before and after the procedure. Patient was prepped and draped in the usual sterile fashion. The correct patient, procedure, and site was verified.   Injection Procedure Details:   Procedure diagnoses: Lumbar radiculopathy [M54.16]    Meds Administered:  Meds ordered this encounter  Medications   methylPREDNISolone acetate (DEPO-MEDROL) injection 80 mg    Laterality: Left  Location/Site:  L4-L5  Needle:5.0 in., 22 ga.  Short bevel or Quincke spinal needle  Needle Placement: Transforaminal  Findings:    -Comments: Excellent flow of contrast along the nerve, nerve root and into the epidural space.  Procedure Details: After squaring off the end-plates to get a true AP view, the C-arm was positioned so that an oblique view of the foramen as noted above was visualized. The target area is just inferior to the "nose of the scotty dog" or sub pedicular. The soft tissues overlying this structure were infiltrated with 2-3 ml. of 1% Lidocaine without Epinephrine.  The spinal needle was inserted toward the target using a "trajectory" view along the fluoroscope beam.  Under AP and lateral visualization, the needle was advanced so it did not puncture dura and was located close the 6 O'Clock position of the pedical in AP tracterory. Biplanar projections were used to confirm position. Aspiration was confirmed to be negative for CSF and/or blood. A 1-2 ml. volume of Isovue-250 was injected and flow of contrast was noted at each level. Radiographs were obtained for documentation purposes.   After attaining the desired flow  of contrast documented above, a 0.5 to 1.0 ml test dose of 0.25% Marcaine was injected into each respective transforaminal space.  The patient was observed for 90 seconds post injection.  After no sensory deficits were reported, and normal lower extremity motor function was noted,   the above injectate was administered so that equal amounts of the injectate were placed at each foramen (level) into the transforaminal epidural space.   Additional Comments:  The patient tolerated the procedure well Dressing: 2 x 2 sterile gauze and Band-Aid    Post-procedure details: Patient was observed during the procedure. Post-procedure instructions were reviewed.  Patient left the clinic in stable condition.

## 2020-11-11 ENCOUNTER — Encounter: Payer: Self-pay | Admitting: Pulmonary Disease

## 2020-11-11 DIAGNOSIS — R053 Chronic cough: Secondary | ICD-10-CM | POA: Insufficient documentation

## 2020-12-13 ENCOUNTER — Other Ambulatory Visit: Payer: Self-pay

## 2020-12-13 ENCOUNTER — Ambulatory Visit (INDEPENDENT_AMBULATORY_CARE_PROVIDER_SITE_OTHER): Payer: 59 | Admitting: Orthopaedic Surgery

## 2020-12-13 ENCOUNTER — Ambulatory Visit: Payer: Self-pay

## 2020-12-13 ENCOUNTER — Encounter: Payer: Self-pay | Admitting: Orthopaedic Surgery

## 2020-12-13 VITALS — Ht 61.0 in | Wt 198.0 lb

## 2020-12-13 DIAGNOSIS — M25561 Pain in right knee: Secondary | ICD-10-CM

## 2020-12-13 DIAGNOSIS — G8929 Other chronic pain: Secondary | ICD-10-CM | POA: Diagnosis not present

## 2020-12-13 NOTE — Progress Notes (Signed)
Office Visit Note   Patient: [name] Mccarthy           Date of Birth: 05/30/1975           MRN: [id number] Visit Date: 12/13/2020              Requested by: Sherren Mocha, MD [address] [place],  [place] [address] PCP: Sherren Mocha, MD   Assessment & Plan: Visit Diagnoses:  1. Chronic pain of right knee     Plan: Impression is right knee pes bursitis.  Although the patient does have signs of arthritis the medial compartment reveals tenderness on exam, believe majority of her symptoms are actually coming from the pes bursa.  We have discussed taking NSAIDs or injecting this with cortisone.  She would like to hold off for now and give this more time to improve on its own.  If her symptoms worsen or do not improve, she may follow-up for cortisone injection.  Otherwise, follow-up with [place] as needed.  Call with concerns or questions in the meantime.  Follow-Up Instructions: Return if symptoms worsen or fail to improve.   Orders:  Orders Placed This Encounter  Procedures   XR KNEE 3 VIEW RIGHT   No orders of the defined types were placed in this encounter.     Procedures: No procedures performed   Clinical Data: No additional findings.   Subjective: Chief Complaint  Patient presents with   Right Knee - Pain    HPI patient is a pleasant 45 year old female who comes in today with right knee pain for the past 4 days.  No known injury.  Only thing she can think of contributing to her symptoms are the fact that she started walking more.  The pain she has is also the medial aspect.  She denies any locking or catching.  No instability.  Pain is worse with walking, stairs as well as pivoting.  She has taken an occasional Advil without significant relief.  She is status post right knee ACL reconstruction back in 2010 followed by right knee scope chondroplasty in 2019 both by Dr. Jerl Santos.  Doing well until recently.  She has not previously undergone cortisone injections to the right  knee  Review of Systems as detailed in HPI.  All others reviewed and are negative.   Objective: Vital Signs: Ht 5\' 1"  (1.549 m)   Wt 198 lb (89.8 kg)   BMI 37.41 kg/m   Physical Exam well-developed and well-nourished female in no acute distress.  Alert and oriented x3.  Ortho Exam right knee exam shows trace effusion.  Range of motion 0 to 120 degrees.  Medial joint line tenderness.  Marked tenderness to the pes bursa.  Mild patellofemoral crepitus.  Ligaments are stable.  She is neurovascular intact distally.  Specialty Comments:  No specialty comments available.  Imaging: XR KNEE 3 VIEW RIGHT  Result Date: 12/13/2020 Moderate degenerative changes to the medial compartment with prior evidence of subchondral plasty to the medial tibial plateau.    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 ago in [place] - specifics unknown by pt.   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 MRI right x 2, left x1.  RIGHT ACL repair (Dr. Margreta Journey) 2011.  MRI after repair: 03/2009:1.  ACL repair noted with intact graft. 2.  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 3.  Trace knee effusion. 4.  Low-level subcutaneous edema anterior to the patellar tendon and tibial tubercl    Family History  Problem Relation Age of Onset   Arthritis Mother        osteo   Anesthesia problems Mother        details unknown by pt.   Hypertension Mother    Hypertension Father     Past  Surgical History:  Procedure Laterality Date   CESAREAN SECTION  09/03/2002   CHROMOPERTUBATION  08/20/2001   DIAGNOSTIC LAPAROSCOPY  08/20/2001   KNEE ARTHROSCOPY Bilateral 2011   Rt ACL repair by Dr. Fara Chute   KNEE ARTHROSCOPY WITH SUBCHONDROPLASTY Right 03/13/2017   Procedure: KNEE ARTHROSCOPY WITH SUBCHONDROPLASTY;  Surgeon: Marcene Corning, MD;  Location: [facility];  Service: Orthopedics;  Laterality: Right;   Social History   Occupational History   Occupation: Paralegal  Tobacco Use   Smoking status: Former    Years: 4.00    Types: Cigarettes    Quit date: 03/03/2007    Years since quitting: 13.7   Smokeless tobacco: Never   Tobacco comments:    5-6 cigarettes per day  Vaping Use   Vaping Use: Never used  Substance and Sexual Activity   Alcohol use: No   Drug use: No   Sexual activity: Not on file

## 2021-01-01 ENCOUNTER — Other Ambulatory Visit: Payer: Self-pay

## 2021-01-01 ENCOUNTER — Encounter: Payer: Self-pay | Admitting: Orthopaedic Surgery

## 2021-01-01 ENCOUNTER — Ambulatory Visit (INDEPENDENT_AMBULATORY_CARE_PROVIDER_SITE_OTHER): Payer: 59 | Admitting: Orthopaedic Surgery

## 2021-01-01 DIAGNOSIS — M25561 Pain in right knee: Secondary | ICD-10-CM | POA: Diagnosis not present

## 2021-01-01 DIAGNOSIS — G8929 Other chronic pain: Secondary | ICD-10-CM

## 2021-01-01 NOTE — Progress Notes (Signed)
Office Visit Note   Patient: [name] Mccarthy           Date of Birth: 26-Jun-1975           MRN: [id number] Visit Date: 01/01/2021              Requested by: Sherren MochaShaw, Eva N, MD [address] [place] [id number] PCP: Sherren MochaShaw, Eva N, MD   Assessment & Plan: Visit Diagnoses:  1. Chronic pain of right knee     Plan: Impression is right pes bursitis.  I recommended a cortisone injection which she agreed to and tolerated well.  We did discuss that if this fails to provide relief neck step would be to obtain MRI to rule out structural abnormalities.  Follow-Up Instructions: No follow-ups on file.   Orders:  No orders of the defined types were placed in this encounter.  No orders of the defined types were placed in this encounter.     Procedures: No procedures performed   Clinical Data: No additional findings.   Subjective: Chief Complaint  Patient presents with   Right Knee - Pain, Follow-up    Patient comes in today for follow-up of continued right knee pain.  We saw her a couple weeks ago for this.  She felt a pop on Thursday.  She is still having a lot of pain and tenderness over the pes bursa.  Denies any mechanical symptoms.   Review of Systems  Constitutional: Negative.   HENT: Negative.    Eyes: Negative.   Respiratory: Negative.    Cardiovascular: Negative.   Endocrine: Negative.   Musculoskeletal: Negative.   Neurological: Negative.   Hematological: Negative.   Psychiatric/Behavioral: Negative.    All other systems reviewed and are negative.   Objective: Vital Signs: There were no vitals taken for this visit.  Physical Exam Vitals and nursing note reviewed.  Constitutional:      Appearance: She is well-developed.  Pulmonary:     Effort: Pulmonary effort is normal.  Skin:    General: Skin is warm.     Capillary Refill: Capillary refill takes less than 2 seconds.  Neurological:     Mental Status: She is alert and oriented to person, place, and  time.  Psychiatric:        Behavior: Behavior normal.        Thought Content: Thought content normal.        Judgment: Judgment normal.    Ortho Exam  Right knee shows exquisite tenderness over the course of the Pez anserine bursa.  No significant medial joint line tenderness.  Negative McMurray.  Collateral cruciates are stable.  No joint effusion.  Specialty Comments:  No specialty comments available.  Imaging: No results found.   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 ago in [place] - specifics unknown by pt.   Knee injury 05/01/2011   Hx of Right knee injury  [facility] [employer] per Dr Loma BostonLauenstein's note. MRI right x 2, left x1.  RIGHT ACL repair (Dr. Margreta Journeyahldorf) 2011.  MRI after repair: 03/2009:1.  ACL repair noted with intact graft. 2.  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 3.  Trace knee effusion. 4.  Low-level subcutaneous edema anterior to the patellar tendon and tibial tubercl    Family History  Problem Relation Age of Onset   Arthritis Mother        osteo   Anesthesia problems Mother        details unknown by pt.   Hypertension Mother    Hypertension Father     Past Surgical History:  Procedure Laterality Date   CESAREAN SECTION  09/03/2002   CHROMOPERTUBATION  08/20/2001   DIAGNOSTIC LAPAROSCOPY  08/20/2001   KNEE ARTHROSCOPY Bilateral 2011   Rt ACL repair by Dr. Fara Chute   KNEE ARTHROSCOPY WITH SUBCHONDROPLASTY Right 03/13/2017   Procedure: KNEE ARTHROSCOPY WITH SUBCHONDROPLASTY;   Surgeon: Marcene Corning, MD;  Location: [facility];  Service: Orthopedics;  Laterality: Right;   Social History   Occupational History   Occupation: Paralegal  Tobacco Use   Smoking status: Former    Years: 4.00    Types: Cigarettes    Quit date: 03/03/2007    Years since quitting: 13.8   Smokeless tobacco: Never   Tobacco comments:    5-6 cigarettes per day  Vaping Use   Vaping Use: Never used  Substance and Sexual Activity   Alcohol use: No   Drug use: No   Sexual activity: Not on file

## 2021-01-10 ENCOUNTER — Other Ambulatory Visit: Payer: Self-pay | Admitting: Pulmonary Disease

## 2021-01-10 LAB — SARS CORONAVIRUS 2 (TAT 6-24 HRS): SARS Coronavirus 2: NEGATIVE

## 2021-01-14 ENCOUNTER — Encounter: Payer: Self-pay | Admitting: Pulmonary Disease

## 2021-01-14 ENCOUNTER — Other Ambulatory Visit: Payer: Self-pay

## 2021-01-14 ENCOUNTER — Ambulatory Visit (INDEPENDENT_AMBULATORY_CARE_PROVIDER_SITE_OTHER): Payer: 59 | Admitting: Pulmonary Disease

## 2021-01-14 VITALS — BP 128/80 | HR 73 | Temp 98.3°F | Ht 61.8 in | Wt 192.4 lb

## 2021-01-14 DIAGNOSIS — R053 Chronic cough: Secondary | ICD-10-CM

## 2021-01-14 DIAGNOSIS — U099 Post covid-19 condition, unspecified: Secondary | ICD-10-CM

## 2021-01-14 DIAGNOSIS — R0981 Nasal congestion: Secondary | ICD-10-CM | POA: Diagnosis not present

## 2021-01-14 LAB — PULMONARY FUNCTION TEST
DL/VA % pred: 117 %
DL/VA: 5.24 ml/min/mmHg/L
DLCO cor % pred: 101 %
DLCO cor: 19.64 ml/min/mmHg
DLCO unc % pred: 101 %
DLCO unc: 19.64 ml/min/mmHg
FEF 25-75 Post: 3.57 L/sec
FEF 25-75 Pre: 2.85 L/sec
FEF2575-%Change-Post: 25 %
FEF2575-%Pred-Post: 130 %
FEF2575-%Pred-Pre: 103 %
FEV1-%Change-Post: 7 %
FEV1-%Pred-Post: 103 %
FEV1-%Pred-Pre: 95 %
FEV1-Post: 2.7 L
FEV1-Pre: 2.5 L
FEV1FVC-%Change-Post: 5 %
FEV1FVC-%Pred-Pre: 102 %
FEV6-%Change-Post: 2 %
FEV6-%Pred-Post: 96 %
FEV6-%Pred-Pre: 94 %
FEV6-Post: 3.08 L
FEV6-Pre: 3.01 L
FEV6FVC-%Change-Post: 0 %
FEV6FVC-%Pred-Post: 102 %
FEV6FVC-%Pred-Pre: 101 %
FVC-%Change-Post: 1 %
FVC-%Pred-Post: 94 %
FVC-%Pred-Pre: 92 %
FVC-Post: 3.08 L
FVC-Pre: 3.02 L
Post FEV1/FVC ratio: 88 %
Post FEV6/FVC ratio: 100 %
Pre FEV1/FVC ratio: 83 %
Pre FEV6/FVC Ratio: 100 %
RV % pred: 16 %
RV: 0.26 L
TLC % pred: 110 %
TLC: 5.1 L

## 2021-01-14 MED ORDER — IPRATROPIUM BROMIDE 0.06 % NA SOLN: 2.0000 | Freq: Four times a day (QID) | NASAL | 6 refills | Status: AC

## 2021-01-14 NOTE — Patient Instructions (Addendum)
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

## 2021-01-14 NOTE — Progress Notes (Signed)
Subjective:   PATIENT ID: [name] Mccarthy GENDER: female DOB: 26-Dec-1975, MRN: [id number]   HPI  Chief Complaint  Patient presents with   Follow-up    PFT review    Reason for Visit: Follow-up  [name] is a 45 year old female with recent history of COVID-19 in April 2022 who presents for follow-up.  Synopsis Diagnosed with COVID-19 in 06/2020. No significant respiratory issues prior to diagnosis. After her COVID-19 URI, she reports cough and wheezing for >4 months. She has chronic sinus issues in the last two years. She has been taking montelukast for the last month. She uses flonase twice a day in the last year with partial relief. She has a dry cough. She reports occasional chest congestion. Cough will improve with honey. Will have wheezing nightly and mainly occurs when laying down.  01/14/21 She has compliant with Symbicort. She reports her symptoms has improved by 60%. Occasional wheezing when laying down but may from her upper sinuses. Continues to have sinus congestion. She uses flonase and montelukast.  Social History: Negligible smoking history. Social smoker 3 years. Daughter is also my patient. 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 ago in [place] - specifics unknown by pt.   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 MRI right x 2, left x1.  RIGHT ACL repair (Dr. Novella Olive) 2011.  MRI after repair: 03/2009:1.  ACL repair noted with intact graft. 2.  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 3.  Trace knee effusion. 4.  Low-level subcutaneous edema anterior to the patellar tendon and tibial tubercl     Family History   Problem Relation Age of Onset   Arthritis Mother        osteo   Anesthesia problems Mother        details unknown by pt.   Hypertension Mother    Hypertension Father      Social History   Occupational History   Occupation: Paralegal  Tobacco Use   Smoking status: Former    Years: 4.00    Types: Cigarettes    Quit date: 03/03/2007    Years since quitting: 13.8   Smokeless tobacco: Never   Tobacco comments:    5-6 cigarettes per day  Vaping Use   Vaping Use: Never used  Substance and Sexual Activity   Alcohol use: No   Drug use: No   Sexual activity: Not on file    Allergies  Allergen Reactions   Hydrocodone Shortness Of Breath, Itching and Palpitations   Oxycodone Shortness Of Breath, Itching and Palpitations     Outpatient Medications Prior to Visit  Medication Sig Dispense Refill   albuterol (VENTOLIN HFA) 108 (90 Base) MCG/ACT inhaler albuterol sulfate HFA 90 mcg/actuation aerosol inhaler  2 PUFFS BY INHALATION EVERY 6 HOURS AS NEEDED     budesonide-formoterol (SYMBICORT) 80-4.5 MCG/ACT inhaler Inhale 2 puffs into the lungs in the morning and at bedtime. 1 each 12   Calcium Carb-Cholecalciferol (CALCIUM-VITAMIN D) 500-200 MG-UNIT tablet Take 1 tablet by mouth daily.     Cholecalciferol (VITAMIN D3) 25 MCG (1000 UT) CAPS Take by mouth daily.  estradiol (CLIMARA - DOSED IN MG/24 HR) 0.1 mg/24hr patch estradiol 0.1 mg/24 hr weekly transdermal patch  Apply 1 patch every week by transdermal route.     fluticasone (FLONASE) 50 MCG/ACT nasal spray Place 1 spray into both nostrils daily.     montelukast (SINGULAIR) 10 MG tablet Take 10 mg by mouth daily.     Multiple Vitamin (MULTIVITAMIN) tablet Take 1 tablet by mouth daily.     PROGESTERONE PO Take 200 mg by mouth.     budesonide-formoterol (SYMBICORT) 80-4.5 MCG/ACT inhaler Inhale 2 puffs into the lungs in the morning and at bedtime. 1 each 6   No facility-administered medications prior to visit.    Review of  Systems  Constitutional:  Negative for chills, diaphoresis, fever, malaise/fatigue and weight loss.  HENT:  Negative for congestion.   Respiratory:  Positive for cough and wheezing. Negative for hemoptysis, sputum production and shortness of breath.   Cardiovascular:  Negative for chest pain, palpitations and leg swelling.    Objective:   Vitals:   01/14/21 1616  BP: 128/80  Pulse: 73  Temp: 98.3 F (36.8 C)  TempSrc: Oral  SpO2: 99%  Weight: 192 lb 6.4 oz (87.3 kg)  Height: 5' 1.8" (1.57 m)   SpO2: 99 % O2 Device: None (Room air)  Physical Exam: General: Well-appearing, no acute distress HENT: [place], AT Eyes: EOMI, no scleral icterus Respiratory: Clear to auscultation bilaterally.  No crackles, wheezing or rales Cardiovascular: RRR, -M/R/G, no JVD Extremities:-Edema,-tenderness Neuro: AAO x4, CNII-XII grossly intact Psych: Normal mood, normal affect  Data Reviewed:  Imaging: CXR 10/11/20 - No infiltrate, effusion or edema  PFT: 01/14/21 FVC 3.08 (94%) FEV1 2.7 (103%) Ratio 83  TLC 110% DLCO 101% Interpretation: Normal PFTs. No significant bronchodilator response however does not preclude benefit of therapy.  Labs: CBC    Component Value Date/Time   WBC 5.4 01/05/2017 1448   WBC 6.0 11/30/2014 0857   RBC 4.33 01/05/2017 1448   RBC 4.42 11/30/2014 0857   HGB 13.6 01/05/2017 1448   HCT 39.8 01/05/2017 1448   PLT 255 01/05/2017 1448   MCV 92 01/05/2017 1448   MCH 31.4 01/05/2017 1448   MCH 29.9 11/30/2014 0857   MCHC 34.2 01/05/2017 1448   MCHC 32.5 11/30/2014 0857   RDW 13.6 01/05/2017 1448   LYMPHSABS 1.9 01/05/2017 1448   EOSABS 0.1 01/05/2017 1448   BASOSABS 0.0 01/05/2017 1448     Assessment & Plan:   Discussion: 45 year old female with COVID-19 long hauler manifesting as cough and wheezing who presents for follow-up. Improved on low dose ICS/LABA. We reviewed clinical course of COVID-19 long hauler including persistent symptoms ranging 3-12 months  post-infection. If symptoms have not resolved after a year, this would be her new baseline however after reviewing PFTs, no long-term damage seen.  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  CT Lung Screen - not qualified  No orders of the defined types were placed in this encounter.  Meds ordered this encounter  Medications   ipratropium (ATROVENT) 0.06 % nasal spray    Sig: Place 2 sprays into both nostrils 4 (four) times daily.    Dispense:  15 mL  Refill:  6    Return in about 4 months (around 05/14/2021).  I have spent a total time of 33-minutes on the day of the appointment reviewing prior documentation, coordinating care and discussing medical diagnosis and plan with the patient/family. Past medical history, allergies, medications were reviewed. Pertinent imaging, labs and tests included in this note have been reviewed and interpreted independently by me.  [place], MD [facility] 01/14/2021 4:52 PM  Office Number [phone]

## 2021-01-14 NOTE — Progress Notes (Signed)
PFT done today. 

## 2021-01-17 ENCOUNTER — Encounter: Payer: Self-pay | Admitting: Pulmonary Disease

## 2021-01-17 DIAGNOSIS — R0981 Nasal congestion: Secondary | ICD-10-CM | POA: Insufficient documentation

## 2021-02-03 ENCOUNTER — Other Ambulatory Visit: Payer: Self-pay

## 2021-02-03 ENCOUNTER — Encounter (HOSPITAL_COMMUNITY): Payer: Self-pay | Admitting: Emergency Medicine

## 2021-02-03 ENCOUNTER — Emergency Department (HOSPITAL_COMMUNITY): Payer: 59

## 2021-02-03 ENCOUNTER — Emergency Department (HOSPITAL_COMMUNITY)
Admission: EM | Admit: 2021-02-03 | Discharge: 2021-02-03 | Disposition: A | Discharge status: Home / Self Care | Payer: 59 | Attending: Emergency Medicine | Admitting: Emergency Medicine

## 2021-02-03 DIAGNOSIS — Z20822 Contact with and (suspected) exposure to covid-19: Secondary | ICD-10-CM | POA: Insufficient documentation

## 2021-02-03 DIAGNOSIS — Z87891 Personal history of nicotine dependence: Secondary | ICD-10-CM | POA: Diagnosis not present

## 2021-02-03 DIAGNOSIS — R509 Fever, unspecified: Secondary | ICD-10-CM | POA: Diagnosis present

## 2021-02-03 DIAGNOSIS — K529 Noninfective gastroenteritis and colitis, unspecified: Secondary | ICD-10-CM | POA: Diagnosis not present

## 2021-02-03 DIAGNOSIS — N9489 Other specified conditions associated with female genital organs and menstrual cycle: Secondary | ICD-10-CM | POA: Insufficient documentation

## 2021-02-03 DIAGNOSIS — Z8616 Personal history of COVID-19: Secondary | ICD-10-CM | POA: Insufficient documentation

## 2021-02-03 LAB — URINALYSIS, ROUTINE W REFLEX MICROSCOPIC
Bilirubin Urine: NEGATIVE
Glucose, UA: 50 mg/dL — AB
Ketones, ur: NEGATIVE mg/dL
Leukocytes,Ua: NEGATIVE
Nitrite: NEGATIVE
Protein, ur: NEGATIVE mg/dL
Specific Gravity, Urine: 1.017 (ref 1.005–1.030)
pH: 5 (ref 5.0–8.0)

## 2021-02-03 LAB — COMPREHENSIVE METABOLIC PANEL
ALT: 22 U/L (ref 0–44)
AST: 13 U/L — ABNORMAL LOW (ref 15–41)
Albumin: 3.8 g/dL (ref 3.5–5.0)
Alkaline Phosphatase: 73 U/L (ref 38–126)
Anion gap: 8 (ref 5–15)
BUN: 15 mg/dL (ref 6–20)
CO2: 22 mmol/L (ref 22–32)
Calcium: 9.1 mg/dL (ref 8.9–10.3)
Chloride: 102 mmol/L (ref 98–111)
Creatinine, Ser: 0.69 mg/dL (ref 0.44–1.00)
GFR, Estimated: >60 mL/min (ref >60)
Glucose, Bld: 207 mg/dL — ABNORMAL HIGH (ref 70–99)
Potassium: 3.4 mmol/L — ABNORMAL LOW (ref 3.5–5.1)
Sodium: 132 mmol/L — ABNORMAL LOW (ref 135–145)
Total Bilirubin: 0.5 mg/dL (ref 0.3–1.2)
Total Protein: 6.9 g/dL (ref 6.5–8.1)

## 2021-02-03 LAB — CBC WITH DIFFERENTIAL/PLATELET
Abs Immature Granulocytes: 0.01 10*3/uL (ref 0.00–0.07)
Basophils Absolute: 0 10*3/uL (ref 0.0–0.1)
Basophils Relative: 0 %
Eosinophils Absolute: 0.1 10*3/uL (ref 0.0–0.5)
Eosinophils Relative: 1 %
HCT: 42.5 % (ref 36.0–46.0)
Hemoglobin: 14.3 g/dL (ref 12.0–15.0)
Immature Granulocytes: 0 %
Lymphocytes Relative: 17 %
Lymphs Abs: 1.2 10*3/uL (ref 0.7–4.0)
MCH: 31.6 pg (ref 26.0–34.0)
MCHC: 33.6 g/dL (ref 30.0–36.0)
MCV: 93.8 fL (ref 80.0–100.0)
Monocytes Absolute: 0.4 10*3/uL (ref 0.1–1.0)
Monocytes Relative: 5 %
Neutro Abs: 5.7 10*3/uL (ref 1.7–7.7)
Neutrophils Relative %: 77 %
Platelets: 215 10*3/uL (ref 150–400)
RBC: 4.53 MIL/uL (ref 3.87–5.11)
RDW: 12.3 % (ref 11.5–15.5)
WBC: 7.4 10*3/uL (ref 4.0–10.5)
nRBC: 0 % (ref 0.0–0.2)

## 2021-02-03 LAB — RESP PANEL BY RT-PCR (FLU A&B, COVID) ARPGX2
Influenza A by PCR: NEGATIVE
Influenza B by PCR: NEGATIVE
SARS Coronavirus 2 by RT PCR: NEGATIVE

## 2021-02-03 LAB — LIPASE, BLOOD: Lipase: 28 U/L (ref 11–51)

## 2021-02-03 LAB — I-STAT BETA HCG BLOOD, ED (MC, WL, AP ONLY): I-stat hCG, quantitative: <5 m[IU]/mL (ref <5)

## 2021-02-03 MED ORDER — AMOXICILLIN-POT CLAVULANATE 875-125 MG PO TABS: 1.0000 | ORAL_TABLET | Freq: Two times a day (BID) | ORAL | 0 refills | Status: DC

## 2021-02-03 MED ORDER — IOHEXOL 350 MG/ML SOLN
80.0000 mL | Freq: Once | INTRAVENOUS | Status: AC | PRN
  Administered 2021-02-03: 19:00:00 80 mL via INTRAVENOUS

## 2021-02-03 MED ORDER — FLUCONAZOLE 150 MG PO TABS: 150.0000 mg | ORAL_TABLET | Freq: Every day | ORAL | 0 refills | Status: AC

## 2021-02-03 MED ORDER — ONDANSETRON HCL 4 MG PO TABS: 4.0000 mg | ORAL_TABLET | Freq: Three times a day (TID) | ORAL | 0 refills | Status: DC | PRN

## 2021-02-03 NOTE — ED Provider Notes (Signed)
[facility]-EMERGENCY DEPT Provider Note   CSN: [id number] Arrival date & time: 02/03/21  1612     History Chief Complaint  Patient presents with   Chills   Diarrhea    [name] Mccarthy is a 45 y.o. female.  The history is provided by the patient. No language interpreter was used.  Diarrhea  45 year old female presenting with cold symptoms.  Pt endorse having fever, chills, abdominal discomfort and, nauseous, and having loose stool since yesterday.  States initial temperature was 101.8 and later on she checked it again and it was 94.  States she has been taking paracetamol as well as Advil for her symptoms.  At this time she reports she is feeling much better, no ongoing nausea no abdominal pain.  She denies having runny nose sneezing or coughing aside from her typical allergy symptoms.  No chest pain or shortness of breath no dysuria.  No recent sick contact.  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 ago in [place] - specifics unknown by pt.   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 MRI right x 2, left x1.  RIGHT ACL repair (Dr. Margreta Journey) 2011.  MRI after repair: 03/2009:1.  ACL repair noted with intact graft. 2.  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 3.  Trace knee effusion. 4.  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 Fara Chute   KNEE ARTHROSCOPY WITH SUBCHONDROPLASTY Right 03/13/2017   Procedure: KNEE ARTHROSCOPY WITH SUBCHONDROPLASTY;  Surgeon: Marcene Corning, MD;  Location: [facility];  Service: Orthopedics;  Laterality: Right;     OB History   No obstetric history on file.     Family History  Problem Relation Age of Onset   Arthritis Mother        osteo   Anesthesia problems Mother        details unknown by pt.   Hypertension Mother    Hypertension Father     Social History   Tobacco Use   Smoking status: Former    Years: 4.00    Types: Cigarettes    Quit date: 03/03/2007    Years since quitting: 13.9   Smokeless tobacco: Never   Tobacco comments:    5-6 cigarettes per day  Vaping Use   Vaping Use: Never used  Substance Use Topics   Alcohol use: No   Drug use: No    Home Medications Prior to Admission medications   Medication Sig Start Date End Date Taking? Authorizing Provider  albuterol (VENTOLIN HFA) 108 (90 Base) MCG/ACT inhaler albuterol sulfate HFA 90 mcg/actuation aerosol inhaler  2 PUFFS BY INHALATION EVERY 6 HOURS AS NEEDED    [provider]  budesonide-formoterol (SYMBICORT) 80-4.5 MCG/ACT inhaler Inhale 2 puffs into the lungs in the morning and at bedtime. 10/12/20   Margaretha Seeds, MD  budesonide-formoterol [facility]) 80-4.5 MCG/ACT inhaler Inhale 2 puffs into the lungs in the morning and at bedtime. 10/11/20   Margaretha Seeds, MD  Calcium Carb-Cholecalciferol (CALCIUM-VITAMIN D) 500-200 MG-UNIT tablet Take 1 tablet by mouth daily.    [provider]  Cholecalciferol (VITAMIN D3) 25 MCG (1000 UT) CAPS Take by mouth daily.    [provider]  estradiol (CLIMARA - DOSED IN MG/24 HR) 0.1 mg/24hr patch estradiol 0.1 mg/24 hr weekly transdermal patch  Apply 1 patch every  week by transdermal route.    [provider]  fluticasone (FLONASE) 50 MCG/ACT nasal spray Place 1 spray into both nostrils daily.    [provider]  ipratropium (ATROVENT) 0.06 % nasal spray Place 2 sprays into both nostrils 4 (four) times daily. 01/14/21   Margaretha Seeds, MD  montelukast (SINGULAIR) 10 MG tablet Take 10 mg by mouth daily. 08/23/20   [provider]  Multiple Vitamin (MULTIVITAMIN) tablet Take 1 tablet by mouth daily.    [provider]  PROGESTERONE PO Take 200 mg by mouth.    [provider]    Allergies    Hydrocodone and Oxycodone  Review of Systems   Review of Systems  Gastrointestinal:  Positive for diarrhea.  All other systems reviewed and are negative.  Physical Exam Updated Vital Signs BP (!) 155/83 (BP Location: Left Arm)   Pulse 79   Temp 98.7 F (37.1 C) (Oral)   Resp 18   LMP 02/02/2021 (Exact Date)   SpO2 100%   Physical Exam Vitals and nursing note reviewed.  Constitutional:      General: She is not in acute distress.    Appearance: She is well-developed. She is obese.  HENT:     Head: Atraumatic.  Eyes:     Conjunctiva/sclera: Conjunctivae normal.  Cardiovascular:     Rate and Rhythm: Normal rate and regular rhythm.     Pulses: Normal pulses.     Heart sounds: Normal heart sounds.  Pulmonary:     Effort: Pulmonary effort is normal.     Breath sounds: No wheezing, rhonchi or rales.  Abdominal:     Palpations: Abdomen is soft.     Tenderness: There is no abdominal tenderness.  Musculoskeletal:     Cervical back: Neck supple.  Skin:    Findings: No rash.  Neurological:     Mental Status: She is alert.  Psychiatric:        Mood and Affect: Mood normal.    ED Results / Procedures / Treatments   Labs (all labs ordered are listed, but only abnormal results are displayed) Labs Reviewed  COMPREHENSIVE METABOLIC PANEL - Abnormal; Notable for the following components:      Result Value    Sodium 132 (*)    Potassium 3.4 (*)    Glucose, Bld 207 (*)    AST 13 (*)    All other components within normal limits  URINALYSIS, ROUTINE W REFLEX MICROSCOPIC - Abnormal; Notable for the following components:   APPearance HAZY (*)    Glucose, UA 50 (*)    Hgb urine dipstick LARGE (*)    Bacteria, UA RARE (*)    All other components within normal limits  RESP PANEL BY RT-PCR (FLU A&B, COVID) ARPGX2  CBC WITH DIFFERENTIAL/PLATELET  LIPASE, BLOOD  I-STAT BETA HCG BLOOD, ED (  [facility], [facility], [facility] ONLY)    EKG None  Radiology CT Abdomen Pelvis W Contrast  Result Date: 02/03/2021 CLINICAL DATA:  Abdominal pain, fever and diarrhea. EXAM: CT ABDOMEN AND PELVIS WITH CONTRAST TECHNIQUE: Multidetector CT imaging of the abdomen and pelvis was performed using the standard protocol following bolus administration of intravenous contrast. CONTRAST:  53mL OMNIPAQUE IOHEXOL 350 MG/ML SOLN COMPARISON:  CT with IV and oral contrast 12/25/2014 FINDINGS: Lower chest: No acute abnormality. Hepatobiliary: 21.8 cm in length mildly steatotic liver without mass. Increased steatosis since 2016. The gallbladder and bile ducts unremarkable. Pancreas: No mass enhancement common ductal dilatation or adjacent inflammation. Spleen: Normal in size and enhancement. Adrenals/Urinary Tract: There is no adrenal mass, no renal cortical mass enhancement, renal calculus or hydronephrosis. Normal bladder thickness. Stomach/Bowel: The gastric wall , unopacified small bowel and appendix are unremarkable. There is mild wall prominence in the distal ascending and transverse colon versus changes of underdistention, without inflammatory change. Left colonic diverticula without diverticulitis findings. Vascular/Lymphatic: The abdominal aorta and hepatic portal vein are unremarkable. Stable shotty subcentimeter mesenteric lymph nodes are redemonstrated unchanged without evidence of enlarged abdominopelvic nodes. Reproductive: The uterus is intact.  Multiple cervical nabothian cysts are again noted, with increased number of cervical cysts since 2016. The ovaries are not enlarged. Other: Small umbilical and inguinal fat hernias. No free air, hemorrhage or fluid. 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 No worrisome regional skeletal lesion. IMPRESSION: 1. Colitis versus nondistention in the distal ascending and transverse colon. No adjacent inflammatory changes. Uncomplicated diverticula on the left. 2. No other evidence of acute abdominal or pelvic process. 3. Mildly prominent liver with mild increased steatosis since 2016. No splenomegaly or portal vein dilatation. 4. Increasing cervical nabothian cysts since [id number]. 5. Umbilical and inguinal fat hernias. Electronically Signed   By: Telford Nab M.D.   On: 02/03/2021 20:24    Procedures Procedures   Medications Ordered in ED Medications  iohexol (OMNIPAQUE) 350 MG/ML injection 80 mL (80 mLs Intravenous Contrast Given 02/03/21 1920)    ED Course  I have reviewed the triage vital signs and the nursing notes.  Pertinent labs & imaging results that were available during my care of the patient were reviewed by me and considered in my medical decision making (see chart for details).    MDM Rules/Calculators/A&P                           BP (!) 147/83 (BP Location: Right Arm)   Pulse 83   Temp 98.4 F (36.9 C) (Oral)   Resp 16   LMP 02/02/2021 (Exact Date)   SpO2 100%   Final Clinical Impression(s) / ED Diagnoses Final diagnoses:  Colitis    Rx / DC Orders ED Discharge Orders          Ordered    ondansetron (ZOFRAN) 4 MG tablet  Every 8 hours PRN        02/03/21 2231    amoxicillin-clavulanate (AUGMENTIN) 875-125 MG tablet  Every 12 hours        02/03/21 2231           10:23 PM Patient endorsed having fever, chills, nausea, and having diarrhea since yesterday.  Multiple symptoms did improve with over-the-counter medication.  She  does not have any cold symptoms.  She is currently on her menstruation.  Pregnancy test is negative, urinalysis shows large hemoglobin on urine dipsticks likely due to her ongoing  menstruation.  No CVA tenderness to suggest kidney stone.Negative viral respiratory panel.  An abdominal pelvis CT scan demonstrate colitis versus nondistention of the distal ascending and transverse colon.  No adjacent inflammatory changes.  Uncomplicated diverticula noted.  There are umbilical and inguinal fat hernia.  No evidence of incarceration or strangulation.  Since patient is mostly symptom-free and vital signs stable, I suspect this is likely to be a viral cause of her colitis.  However after discussion, I will prescribe antibiotic to use in the event her symptoms become progressively worse.  Otherwise continue with over-the-counter medication.  Return precaution given.  Patient voiced understanding and agrees with plan.   Domenic Moras, PA-C 02/03/21 2234    Valarie Merino, MD 02/03/21 [phone]

## 2021-02-03 NOTE — Discharge Instructions (Addendum)
Your symptoms are consistent with colitis.  Continue to take tylenol or advil as needed for pain.  If your symptoms worsen for the next several days, you may take antibiotic prescribed as treatment.  Return if you have any concerns.

## 2021-02-03 NOTE — ED Notes (Signed)
Patient has a urine culture in the main lab 

## 2021-02-03 NOTE — ED Provider Notes (Signed)
Emergency Medicine Provider Triage Evaluation Note  [name] Mccarthy , a 45 y.o. female  was evaluated in triage.  Pt complains of fever.  Began yesterday.  Has noted multiple episodes of loose stool.  No recent antibiotics or travel.  Had some nausea.  Did recently start menstrual cycle today.  No headache, cough, rhinorrhea, sore throat, chest pain, shortness of breath, abdominal pain.  No urinary symptoms  Review of Systems  Positive: Fever, diarrhea Negative: Abdominal pain, chest pain, shortness of breath, cough  Physical Exam  BP (!) 142/66 (BP Location: Left Arm)   Pulse 85   Temp 98 F (36.7 C) (Oral)   Resp 18   SpO2 99%  Gen:   Awake, no distress   Resp:  Normal effort  MSK:   Moves extremities without difficulty  Other:    Medical Decision Making  Medically screening exam initiated at 4:49 PM.  Appropriate orders placed.  [name] Mccarthy was informed that the remainder of the evaluation will be completed by another provider, this initial triage assessment does not replace that evaluation, and the importance of remaining in the ED until their evaluation is complete.  Fever, diarrhea   [name] Mccarthy A, PA-C 02/03/21 1650    Derwood Kaplan, MD 02/03/21 1755

## 2021-02-03 NOTE — ED Triage Notes (Signed)
PT c/o chills, fever, nausea, and diarrhea since yesterday. Denies vomiting.

## 2021-05-09 ENCOUNTER — Encounter: Payer: Self-pay | Admitting: Gastroenterology

## 2021-05-14 ENCOUNTER — Ambulatory Visit: Payer: Self-pay | Admitting: Pulmonary Disease

## 2021-05-28 ENCOUNTER — Encounter: Payer: Self-pay | Admitting: Gastroenterology

## 2021-05-30 ENCOUNTER — Ambulatory Visit: Payer: Self-pay | Admitting: Gastroenterology

## 2021-06-18 ENCOUNTER — Ambulatory Visit: Payer: Self-pay | Admitting: Gastroenterology

## 2021-06-19 ENCOUNTER — Ambulatory Visit: Payer: Self-pay | Admitting: Gastroenterology

## 2021-08-14 ENCOUNTER — Telehealth: Payer: Self-pay | Admitting: Physical Medicine and Rehabilitation

## 2021-08-14 NOTE — Telephone Encounter (Signed)
Pt called requesting a call back to set an appt. Please call pt at [phone].

## 2021-08-20 ENCOUNTER — Ambulatory Visit (INDEPENDENT_AMBULATORY_CARE_PROVIDER_SITE_OTHER): Payer: 59 | Admitting: Orthopaedic Surgery

## 2021-08-20 DIAGNOSIS — G8929 Other chronic pain: Secondary | ICD-10-CM

## 2021-08-20 DIAGNOSIS — M25561 Pain in right knee: Secondary | ICD-10-CM | POA: Diagnosis not present

## 2021-08-20 MED ORDER — TRAMADOL HCL 50 MG PO TABS: 50.0000 mg | ORAL_TABLET | Freq: Two times a day (BID) | ORAL | 2 refills | Status: AC | PRN

## 2021-08-20 NOTE — Progress Notes (Signed)
Office Visit Note   Patient: [name] Mccarthy           Date of Birth: September 02, 1975           MRN: [id number] Visit Date: 08/20/2021              Requested by: No referring provider defined for this encounter. PCP: Pcp, No   Assessment & Plan: Visit Diagnoses:  1. Chronic pain of right knee     Plan: Impression is chronic right knee pain primarily to the pes bursa but also to the medial joint line.  At this point, we do know she has underlying osteoarthritis but would like to go ahead and order an MRI to assess for structural abnormalities.  She will follow-up with [place] once this is been completed.  Call with concerns or questions.  Follow-Up Instructions: Return for after MRI.   Orders:  No orders of the defined types were placed in this encounter.  No orders of the defined types were placed in this encounter.     Procedures: No procedures performed   Clinical Data: No additional findings.   Subjective: Chief Complaint  Patient presents with   Right Knee - Pain    HPI patient is a pleasant 46 year old female who comes in today with chronic right knee pain.  She has been seeing [place] for this for greater than 6 months.  She has an underlying history of medial compartment osteoarthritis with previous ACL reconstruction as well as knee arthroscopy and chondroplasty in 2010 and 2019 respectively.  The pain she has continued to have is primarily to the pes bursa.  She has tried oral anti-inflammatories as well as cortisone injection to the pes bursa with temporary relief.  She describes chronic pain to the pes bursa primarily with standing for a long time.   Review of Systems as detailed in HPI.  All others reviewed and are negative.   Objective: Vital Signs: There were no vitals taken for this visit.  Physical Exam well-developed well-nourished female no acute distress.  Alert and oriented x3.  Ortho Exam right knee exam shows trace effusion.  Range of motion 0 to 100  degrees.  Marked tenderness and mild swelling to the pes bursa.  Mild medial joint line tenderness.  She is neurovascular tact distally.  Specialty Comments:  No specialty comments available.  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 ago in [place] - specifics unknown by pt.   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 MRI right x 2, left x1.  RIGHT ACL repair (Dr. Margreta Journey) 2011.  MRI after repair: 03/2009:1.  ACL repair noted with intact graft. 2.  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 3.  Trace knee effusion. 4.  Low-level subcutaneous edema anterior to the patellar tendon and tibial tubercl    Family History  Problem Relation Age of Onset   Arthritis Mother  osteo   Anesthesia problems Mother        details unknown by pt.   Hypertension Mother    Hypertension Father     Past Surgical History:  Procedure Laterality Date   CESAREAN SECTION  09/03/2002   CHROMOPERTUBATION  08/20/2001   DIAGNOSTIC LAPAROSCOPY  08/20/2001   KNEE ARTHROSCOPY Bilateral 2011   Rt ACL repair by Dr. Fara Chute   KNEE ARTHROSCOPY WITH SUBCHONDROPLASTY Right 03/13/2017   Procedure: KNEE ARTHROSCOPY WITH SUBCHONDROPLASTY;  Surgeon: Marcene Corning, MD;  Location: [facility];  Service: Orthopedics;  Laterality: Right;   Social History   Occupational History   Occupation: Paralegal  Tobacco Use    Smoking status: Former    Years: 4.00    Types: Cigarettes    Quit date: 03/03/2007    Years since quitting: 14.4   Smokeless tobacco: Never   Tobacco comments:    5-6 cigarettes per day  Vaping Use   Vaping Use: Never used  Substance and Sexual Activity   Alcohol use: No   Drug use: No   Sexual activity: Not on file

## 2021-08-26 ENCOUNTER — Ambulatory Visit: Payer: Self-pay | Admitting: Gastroenterology

## 2021-09-16 ENCOUNTER — Telehealth: Payer: Self-pay | Admitting: Orthopaedic Surgery

## 2021-09-16 NOTE — Telephone Encounter (Signed)
Ok to do for one month, but also need to get mri right knee

## 2021-09-16 NOTE — Telephone Encounter (Signed)
Patient called asking for another note for her employer stating that she cannot stand for long period of time. CB# [phone]

## 2021-09-29 ENCOUNTER — Ambulatory Visit (HOSPITAL_COMMUNITY)
Admission: RE | Admit: 2021-09-29 | Discharge: 2021-09-29 | Disposition: A | Discharge status: Home / Self Care | Payer: 59 | Source: Ambulatory Visit | Attending: Orthopaedic Surgery | Admitting: Orthopaedic Surgery

## 2021-09-29 DIAGNOSIS — M25561 Pain in right knee: Secondary | ICD-10-CM | POA: Insufficient documentation

## 2021-09-29 DIAGNOSIS — G8929 Other chronic pain: Secondary | ICD-10-CM | POA: Insufficient documentation

## 2021-10-15 ENCOUNTER — Telehealth: Payer: Self-pay

## 2021-10-15 ENCOUNTER — Ambulatory Visit (INDEPENDENT_AMBULATORY_CARE_PROVIDER_SITE_OTHER): Payer: 59 | Admitting: Orthopaedic Surgery

## 2021-10-15 DIAGNOSIS — G8929 Other chronic pain: Secondary | ICD-10-CM | POA: Diagnosis not present

## 2021-10-15 DIAGNOSIS — M25561 Pain in right knee: Secondary | ICD-10-CM | POA: Diagnosis not present

## 2021-10-15 NOTE — Telephone Encounter (Signed)
Precert for right knee visco approval. Dr.Xu's patient. Thanks!

## 2021-10-15 NOTE — Progress Notes (Signed)
Office Visit Note   Patient: [name] Mccarthy           Date of Birth: 06/26/1975           MRN: [id number] Visit Date: 10/15/2021              Requested by: No referring provider defined for this encounter. PCP: Pcp, No   Assessment & Plan: Visit Diagnoses:  1. Chronic pain of right knee     Plan: impression is chronic right knee pain.  Structurally, her knee looks good with the exception of mild osteoarthritis to the medial and patellofemoral compartments.  She has not had good relief from previous cortisone injections, so I have discussed trying a course of viscosupplementation for which she is agreeable to.   This patient is diagnosed with osteoarthritis of the knee(s).    Radiographs show evidence of joint space narrowing, osteophytes, subchondral sclerosis and/or subchondral cysts.  This patient has knee pain which interferes with functional and activities of daily living.    This patient has experienced inadequate response, adverse effects and/or intolerance with conservative treatments such as acetaminophen, NSAIDS, topical creams, physical therapy or regular exercise, knee bracing and/or weight loss.   This patient has experienced inadequate response or has a contraindication to intra articular steroid injections for at least 3 months.   This patient is not scheduled to have a total knee replacement within 6 months of starting treatment with viscosupplementation.   Follow-Up Instructions: Return for once approved for visco injection.   Orders:  No orders of the defined types were placed in this encounter.  No orders of the defined types were placed in this encounter.     Procedures: No procedures performed   Clinical Data: No additional findings.   Subjective: Chief Complaint  Patient presents with   Right Knee - Pain    HPI patient is a pleasant 46 year old female who comes in today with chronic right knee pain.  She is here to discuss MRI results of her  right knee.  She has been complaining of years of pain primarily to the anteromedial aspect of the knee.  Pain is worse with walking, stairs and standing all day while at work.  She has undergone knee injection as well as pes bursa injection without relief.  Recent MRI shows mild degenerative changes to the medial and patellofemoral compartments, but nothing more.       Objective: Vital Signs: There were no vitals taken for this visit.    Ortho Exam unchanged right knee exam  Specialty Comments:  No specialty comments available.  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 ago in [place] - specifics unknown by pt.   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 MRI right x 2, left x1.  RIGHT ACL repair (Dr. Margreta Journey) 2011.  MRI after repair: 03/2009:1.  ACL repair noted with intact graft. 2.  Similar appearance of internal  degeneration in the posterior cruciate ligament. 3.  Trace knee effusion. 4.  Low-level subcutaneous edema anterior to the patellar tendon and tibial tubercl    Family History  Problem Relation Age of Onset   Arthritis Mother        osteo   Anesthesia problems Mother        details unknown by pt.   Hypertension Mother    Hypertension Father     Past Surgical History:  Procedure Laterality Date   CESAREAN SECTION  09/03/2002   CHROMOPERTUBATION  08/20/2001   DIAGNOSTIC LAPAROSCOPY  08/20/2001   KNEE ARTHROSCOPY  Bilateral 2011   Rt ACL repair by Dr. Fara Chute   KNEE ARTHROSCOPY WITH SUBCHONDROPLASTY Right 03/13/2017   Procedure: KNEE ARTHROSCOPY WITH SUBCHONDROPLASTY;  Surgeon: Marcene Corning, MD;  Location: [facility];  Service: Orthopedics;  Laterality: Right;   Social History   Occupational History   Occupation: Paralegal  Tobacco Use   Smoking status: Former    Years: 4.00    Types: Cigarettes    Quit date: 03/03/2007    Years since quitting: 14.6   Smokeless tobacco: Never   Tobacco comments:    5-6 cigarettes per day  Vaping Use   Vaping Use: Never used  Substance and Sexual Activity   Alcohol use: No   Drug use: No   Sexual activity: Not on file

## 2021-10-17 NOTE — Telephone Encounter (Signed)
Noted  

## 2021-10-17 NOTE — Telephone Encounter (Signed)
Called and left a VM for patient to CB to clarify which insurance is the correct one.

## 2021-11-12 DIAGNOSIS — H9313 Tinnitus, bilateral: Secondary | ICD-10-CM | POA: Diagnosis not present

## 2021-11-12 DIAGNOSIS — S3992XA Unspecified injury of lower back, initial encounter: Secondary | ICD-10-CM | POA: Diagnosis not present

## 2021-11-12 DIAGNOSIS — J309 Allergic rhinitis, unspecified: Secondary | ICD-10-CM | POA: Diagnosis not present

## 2022-01-21 DIAGNOSIS — Z113 Encounter for screening for infections with a predominantly sexual mode of transmission: Secondary | ICD-10-CM | POA: Diagnosis not present

## 2022-01-21 DIAGNOSIS — Z124 Encounter for screening for malignant neoplasm of cervix: Secondary | ICD-10-CM | POA: Diagnosis not present

## 2022-01-21 DIAGNOSIS — R69 Illness, unspecified: Secondary | ICD-10-CM | POA: Diagnosis not present

## 2022-01-22 ENCOUNTER — Other Ambulatory Visit: Payer: Self-pay | Admitting: Obstetrics and Gynecology

## 2022-01-22 DIAGNOSIS — M8580 Other specified disorders of bone density and structure, unspecified site: Secondary | ICD-10-CM

## 2022-01-22 DIAGNOSIS — Z1231 Encounter for screening mammogram for malignant neoplasm of breast: Secondary | ICD-10-CM | POA: Diagnosis not present

## 2022-01-29 ENCOUNTER — Other Ambulatory Visit: Payer: Self-pay | Admitting: Obstetrics and Gynecology

## 2022-01-29 DIAGNOSIS — R928 Other abnormal and inconclusive findings on diagnostic imaging of breast: Secondary | ICD-10-CM

## 2022-02-14 ENCOUNTER — Ambulatory Visit
Admission: RE | Admit: 2022-02-14 | Discharge: 2022-02-14 | Disposition: A | Discharge status: Home / Self Care | Payer: 59 | Source: Ambulatory Visit | Attending: Obstetrics and Gynecology | Admitting: Obstetrics and Gynecology

## 2022-02-14 ENCOUNTER — Ambulatory Visit: Payer: 59

## 2022-02-14 DIAGNOSIS — R928 Other abnormal and inconclusive findings on diagnostic imaging of breast: Secondary | ICD-10-CM

## 2022-02-14 DIAGNOSIS — R922 Inconclusive mammogram: Secondary | ICD-10-CM | POA: Diagnosis not present

## 2022-02-27 DIAGNOSIS — J309 Allergic rhinitis, unspecified: Secondary | ICD-10-CM | POA: Diagnosis not present

## 2022-02-27 DIAGNOSIS — R509 Fever, unspecified: Secondary | ICD-10-CM | POA: Diagnosis not present

## 2022-02-27 DIAGNOSIS — R6889 Other general symptoms and signs: Secondary | ICD-10-CM | POA: Diagnosis not present

## 2022-02-27 DIAGNOSIS — Z03818 Encounter for observation for suspected exposure to other biological agents ruled out: Secondary | ICD-10-CM | POA: Diagnosis not present

## 2022-03-12 DIAGNOSIS — G4700 Insomnia, unspecified: Secondary | ICD-10-CM | POA: Diagnosis not present

## 2022-03-12 DIAGNOSIS — N898 Other specified noninflammatory disorders of vagina: Secondary | ICD-10-CM | POA: Diagnosis not present

## 2022-03-12 DIAGNOSIS — N951 Menopausal and female climacteric states: Secondary | ICD-10-CM | POA: Diagnosis not present

## 2022-03-26 ENCOUNTER — Ambulatory Visit: Payer: 59 | Admitting: Obstetrics and Gynecology

## 2022-03-26 ENCOUNTER — Encounter: Payer: Self-pay | Admitting: Obstetrics and Gynecology

## 2022-03-26 VITALS — BP 126/84 | HR 73 | Ht 61.0 in | Wt 185.0 lb

## 2022-03-26 DIAGNOSIS — R350 Frequency of micturition: Secondary | ICD-10-CM | POA: Diagnosis not present

## 2022-03-26 DIAGNOSIS — R81 Glycosuria: Secondary | ICD-10-CM | POA: Diagnosis not present

## 2022-03-26 DIAGNOSIS — N393 Stress incontinence (female) (male): Secondary | ICD-10-CM | POA: Diagnosis not present

## 2022-03-26 DIAGNOSIS — R159 Full incontinence of feces: Secondary | ICD-10-CM

## 2022-03-26 LAB — POCT URINALYSIS DIPSTICK
Bilirubin, UA: NEGATIVE
Blood, UA: NEGATIVE
Glucose, UA: POSITIVE — AB
Ketones, UA: NEGATIVE
Leukocytes, UA: NEGATIVE
Nitrite, UA: NEGATIVE
Protein, UA: NEGATIVE
Spec Grav, UA: 1.015 (ref 1.010–1.025)
Urobilinogen, UA: 0.2 E.U./dL
pH, UA: 5.5 (ref 5.0–8.0)

## 2022-03-26 NOTE — Progress Notes (Unsigned)
[facility] New Patient Evaluation and Consultation  Referring Provider: Josefine Class* PCP: Pcp, No Date of Service: 03/26/2022  SUBJECTIVE Chief Complaint: New Patient (Initial Visit) ([name] Mccarthy is a 47 y.o. female is here due to urinary leakage.)  History of Present Illness: [name] Mccarthy is a 47 y.o. White or Caucasian female seen in consultation at the request of PA Phedra [place]enn[place] for evaluation of incontinence.    Review of records significant for: Has 2 year history of urinary and fecal incontinence. Has tried doing kegels on her own without improvement.   Urinary Symptoms: Leaks urine with movement, without sensation and while asleep. Sometimes hard to tell when she is leaking but usually with shifting, getting up from a chair, etc. Denies urgency.  Leaks 4-5 time(s) per day.  Pad use: none.   She is bothered by her UI symptoms.  Day time voids 4-5.  Nocturia: 0 times per night to void. Voiding dysfunction: she empties her bladder well.  does not use a catheter to empty bladder.  When urinating, she feels she has no difficulties Drinks: 1 cup coffee in AM, water per day  UTIs:  0  UTI's in the last year.   Denies history of blood in urine and kidney or bladder stones  Pelvic Organ Prolapse Symptoms:                  She Denies a feeling of a bulge the vaginal area.   Bowel Symptom: Bowel movements: 1-2 time(s) per day Stool consistency: soft  Straining: no.  Splinting: no.  Incomplete evacuation: yes.  She Admits to accidental bowel leakage / fecal incontinence  Occurs: 1 time(s) per day- small amounts  Consistency with leakage: liquid Bowel regimen: fiber Last colonoscopy: none  Sexual Function Sexually active: yes.  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 ago in [place] - specifics unknown by pt.   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 MRI right x 2, left x1.  RIGHT ACL repair (Dr. Novella Olive) 2011.  MRI after repair: 03/2009:1.  ACL repair noted with intact graft. 2.  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 3.  Trace knee effusion. 4.  Low-level subcutaneous edema anterior to the patellar tendon and tibial tubercl     Past Surgical History:   Past Surgical History:  Procedure Laterality Date   CESAREAN SECTION  09/03/2002   CHROMOPERTUBATION  08/20/2001   DIAGNOSTIC LAPAROSCOPY  08/20/2001   hemorrhoid removal     KNEE ARTHROSCOPY Bilateral 2011   Rt ACL repair by Dr. Norm Salt   KNEE ARTHROSCOPY WITH SUBCHONDROPLASTY Right 03/13/2017   Procedure: KNEE ARTHROSCOPY WITH SUBCHONDROPLASTY;  Surgeon: Melrose Nakayama, MD;  Location: [facility];  Service: Orthopedics;  Laterality: Right;     Past OB/GYN History: OB History  Gravida Para Term Preterm AB Living  1 1 1     1   SAB IAB Ectopic Multiple Live Births               # Outcome Date GA Lbr  Len/2nd Weight Sex Delivery Anes PTL Lv  1 Term            Cesarean section: 1 Menopausal- currently on HRT Contraception: none. Last pap smear was 01/2022- negative per patient.  Any history of abnormal pap smears: no.   Medications: She has a current medication list which includes the following prescription(s): albuterol, calcium-vitamin d, estradiol, fluconazole, fluticasone, montelukast, multivitamin, progesterone, amoxicillin-clavulanate, budesonide-formoterol, vitamin d3, ipratropium, ondansetron, and tramadol.   Allergies: Patient is allergic to hydrocodone and oxycodone.   Social History:  Social History   Tobacco Use    Smoking status: Former    Years: 4.00    Types: Cigarettes    Quit date: 03/03/2007    Years since quitting: 15.0   Smokeless tobacco: Never   Tobacco comments:    5-6 cigarettes per day  Vaping Use   Vaping Use: Never used  Substance Use Topics   Alcohol use: No   Drug use: No    Relationship status: married She lives with husband.   She is not employed. Regular exercise: No History of abuse: No  Family History:   Family History  Problem Relation Age of Onset   Arthritis Mother        osteo   Osteoporosis Mother    Clotting disorder Father    Asthma Daughter      Review of Systems: Review of Systems  Constitutional:  Negative for fever, malaise/fatigue and weight loss.  Respiratory:  Negative for cough, shortness of breath and wheezing.   Cardiovascular:  Negative for chest pain, palpitations and leg swelling.  Gastrointestinal:  Negative for abdominal pain and blood in stool.  Genitourinary:  Negative for dysuria.  Musculoskeletal:  Negative for myalgias.  Skin:  Negative for rash.  Neurological:  Negative for dizziness and headaches.  Endo/Heme/Allergies:  Does not bruise/bleed easily.  Psychiatric/Behavioral:  Negative for depression. The patient is not nervous/anxious.      OBJECTIVE Physical Exam: Vitals:   03/26/22 1520  BP: 126/84  Pulse: 73  Weight: 185 lb (83.9 kg)  Height: 5\' 1"  (1.549 m)    Physical Exam Constitutional:      General: She is not in acute distress. Pulmonary:     Effort: Pulmonary effort is normal.  Abdominal:     General: There is no distension.     Palpations: Abdomen is soft.     Tenderness: There is no abdominal tenderness. There is no rebound.  Musculoskeletal:        General: No swelling. Normal range of motion.  Skin:    General: Skin is warm and dry.     Findings: No rash.  Neurological:     Mental Status: She is alert and oriented to person, place, and time.  Psychiatric:        Mood and Affect: Mood normal.         Behavior: Behavior normal.      GU / Detailed Urogynecologic Evaluation:  Pelvic Exam: Normal external female genitalia; Bartholin's and Skene's glands normal in appearance; urethral meatus normal in appearance, no urethral masses or discharge.   CST: negative  Speculum exam reveals normal vaginal mucosa with atrophy. Cervix normal appearance. Uterus normal single, nontender. Adnexa no mass, fullness, tenderness.    Pelvic floor strength I/V, puborectalis I/V external anal sphincter I/V  Pelvic floor musculature: Right levator non-tender, Right obturator non-tender, Left levator non-tender, Left obturator non-tender  POP-Q:   POP-Q  -3  Aa   -3                                           Ba  -6.5                                              C   3.5                                            Gh  4                                            Pb  9                                            tvl   -2                                            Ap  -2                                            Bp  -8.5                                              D      Rectal Exam:  Normal sphincter tone, no distal rectocele, enterocoele not present, no rectal masses, no sign of dyssynergia when asking the patient to bear down.  Post-Void Residual (PVR) by Bladder Scan: In order to evaluate bladder emptying, we discussed obtaining a postvoid residual and she agreed to this procedure.  Procedure: The ultrasound unit was placed on the patient's abdomen in the suprapubic region after the patient had voided. A PVR of 34 ml was obtained by bladder scan.  Laboratory Results: POC urine: >1000 glucose   ASSESSMENT AND PLAN Ms. Breshears is a 47 y.o. with:  1. SUI (stress urinary incontinence, female)   2. Incontinence of feces, unspecified fecal incontinence type   3. Urinary frequency   4. Glucosuria    SUI - For treatment of stress  urinary incontinence,  non-surgical options include expectant management, weight loss, physical therapy, as well as a pessary.  Surgical options include a midurethral sling, Burch urethropexy, and transurethral injection of a bulking agent. - She would like to start with pelvic PT, referral placed.   2. Accidental Bowel Leakage:  - Treatment options include anti-diarrhea medication (loperamide/ Imodium OTC or prescription lomotil), fiber supplements, physical therapy, and possible sacral neuromodulation or surgery.   - Recommended starting psyllium fiber supplementation daily for stool  bulking- start with a teaspoon and gradually increase to a tbsp.  - Will also start pelvic PT  3. Glucosuria - Significant amount of glucose in the urine. Recommended patient follow up with PCP for testing for diabetes.   Return if no improvement with pelvic PT   Jaquita Folds, MD

## 2022-03-26 NOTE — Patient Instructions (Addendum)
Accidental Bowel Leakage: Our goal is to achieve formed bowel movements daily or every-other-day without leakage.  You may need to try different combinations of the following options to find what works best for you.  Some management options include: Dietary changes (more leafy greens, vegetables and fruits; less processed foods) Fiber supplementation (Metamucil or something with psyllium as active ingredient)- start with a teaspoon and work up to a tablespoon daily. Over-the-counter imodium (tablets or liquid) to help solidify the stool and prevent leakage of stool.  For treatment of stress urinary incontinence, which is leakage with physical activity/movement/strainging/coughing, we discussed expectant management versus nonsurgical options versus surgery. Nonsurgical options include weight loss, physical therapy, as well as a pessary.  Surgical options include a midurethral sling, which is a synthetic mesh sling that acts like a hammock under the urethra to prevent leakage of urine,  and transurethral injection of a bulking agent.

## 2022-04-09 ENCOUNTER — Telehealth: Payer: Self-pay | Admitting: Orthopaedic Surgery

## 2022-04-09 NOTE — Telephone Encounter (Signed)
Received call from patient. She requesting copy of her medical records. I advised she needs to sign authorization form. She will come by the office tomorrow. I advised pt I will call when ready, that turn around time is couple of days. [phone]

## 2022-04-21 ENCOUNTER — Encounter: Payer: Self-pay | Admitting: *Deleted

## 2022-04-23 DIAGNOSIS — N951 Menopausal and female climacteric states: Secondary | ICD-10-CM | POA: Diagnosis not present

## 2022-04-23 DIAGNOSIS — G4700 Insomnia, unspecified: Secondary | ICD-10-CM | POA: Diagnosis not present

## 2022-04-23 DIAGNOSIS — N898 Other specified noninflammatory disorders of vagina: Secondary | ICD-10-CM | POA: Diagnosis not present

## 2022-04-23 DIAGNOSIS — N939 Abnormal uterine and vaginal bleeding, unspecified: Secondary | ICD-10-CM | POA: Diagnosis not present

## 2022-04-23 DIAGNOSIS — Z79890 Hormone replacement therapy (postmenopausal): Secondary | ICD-10-CM | POA: Diagnosis not present

## 2022-05-02 ENCOUNTER — Ambulatory Visit: Payer: 59 | Admitting: Physical Therapy

## 2022-06-11 ENCOUNTER — Ambulatory Visit: Payer: 59 | Admitting: Physical Therapy

## 2022-07-10 ENCOUNTER — Ambulatory Visit
Admission: RE | Admit: 2022-07-10 | Discharge: 2022-07-10 | Disposition: A | Discharge status: Home / Self Care | Payer: 59 | Source: Ambulatory Visit | Attending: Obstetrics and Gynecology | Admitting: Obstetrics and Gynecology

## 2022-07-10 DIAGNOSIS — Z13820 Encounter for screening for osteoporosis: Secondary | ICD-10-CM | POA: Diagnosis not present

## 2022-07-10 DIAGNOSIS — M8580 Other specified disorders of bone density and structure, unspecified site: Secondary | ICD-10-CM

## 2022-07-23 DIAGNOSIS — M8580 Other specified disorders of bone density and structure, unspecified site: Secondary | ICD-10-CM | POA: Diagnosis not present

## 2022-07-23 DIAGNOSIS — N951 Menopausal and female climacteric states: Secondary | ICD-10-CM | POA: Diagnosis not present

## 2022-07-23 DIAGNOSIS — N898 Other specified noninflammatory disorders of vagina: Secondary | ICD-10-CM | POA: Diagnosis not present

## 2022-07-31 DIAGNOSIS — M8580 Other specified disorders of bone density and structure, unspecified site: Secondary | ICD-10-CM | POA: Diagnosis not present

## 2022-08-04 ENCOUNTER — Ambulatory Visit: Payer: 59 | Admitting: Physical Therapy

## 2022-09-03 ENCOUNTER — Other Ambulatory Visit: Payer: Self-pay | Admitting: Anesthesiology

## 2022-09-03 DIAGNOSIS — M5416 Radiculopathy, lumbar region: Secondary | ICD-10-CM

## 2022-09-15 ENCOUNTER — Ambulatory Visit
Admission: RE | Admit: 2022-09-15 | Discharge: 2022-09-15 | Disposition: A | Discharge status: Home / Self Care | Payer: Worker's Compensation | Source: Ambulatory Visit | Attending: Anesthesiology

## 2022-09-15 ENCOUNTER — Ambulatory Visit
Admission: RE | Admit: 2022-09-15 | Discharge: 2022-09-15 | Disposition: A | Discharge status: Home / Self Care | Payer: Worker's Compensation | Source: Ambulatory Visit | Attending: Anesthesiology | Admitting: Anesthesiology

## 2022-09-15 DIAGNOSIS — M5416 Radiculopathy, lumbar region: Secondary | ICD-10-CM

## 2022-09-15 MED ORDER — MEPERIDINE HCL 50 MG/ML IJ SOLN
50.0000 mg | Freq: Once | INTRAMUSCULAR | Status: AC | PRN
  Administered 2022-09-15: 50 mg via INTRAMUSCULAR

## 2022-09-15 MED ORDER — ONDANSETRON HCL 4 MG/2ML IJ SOLN
4.0000 mg | Freq: Once | INTRAMUSCULAR | Status: AC | PRN
  Administered 2022-09-15: 4 mg via INTRAMUSCULAR

## 2022-09-15 MED ORDER — IOPAMIDOL (ISOVUE-M 200) INJECTION 41%
20.0000 mL | Freq: Once | INTRAMUSCULAR | Status: AC
  Administered 2022-09-15: 20 mL via INTRATHECAL

## 2022-09-15 MED ORDER — DIAZEPAM 5 MG PO TABS
10.0000 mg | ORAL_TABLET | Freq: Once | ORAL | Status: AC
  Administered 2022-09-15: 10 mg via ORAL

## 2022-09-15 NOTE — Discharge Instr - Other Info (Addendum)
1335- pt rating pain 9/10 post procedure-see MAR 1400: Pain 5/10, relief noted.

## 2022-09-15 NOTE — Discharge Instructions (Signed)

## 2022-09-25 DIAGNOSIS — E559 Vitamin D deficiency, unspecified: Secondary | ICD-10-CM | POA: Diagnosis not present

## 2022-10-16 IMAGING — CT CT ABD-PELV W/ CM
2 of 5 series · 16 of 46 positions shown, 18 images · IV contrast (omnipaque)
Comparison: CT with IV and oral contrast 12/25/2014

CLINICAL DATA: Abdominal pain, fever and diarrhea.

EXAM:
CT ABDOMEN AND PELVIS WITH CONTRAST
TECHNIQUE: Multidetector CT imaging of the abdomen and pelvis was performed
using the standard protocol following bolus administration of
intravenous contrast.
CONTRAST:  80mL OMNIPAQUE IOHEXOL 350 MG/ML SOLN

[Series 2: axial st · axial · 0.85mm/px · z∈[+1016,+1401]mm · 13 of 91 slices shown, 15 images]
[im 7/91  soft-tissue]
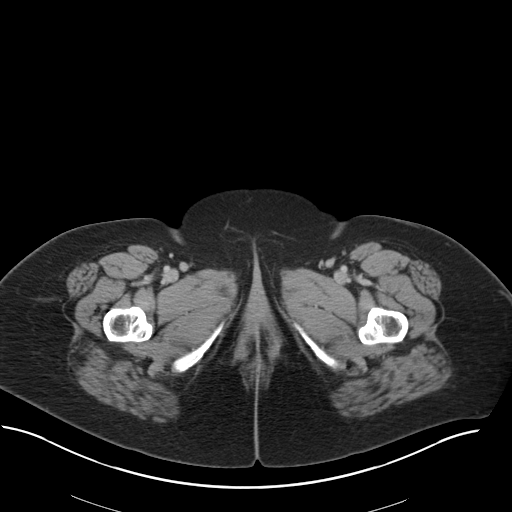
[im 7/91  bone]
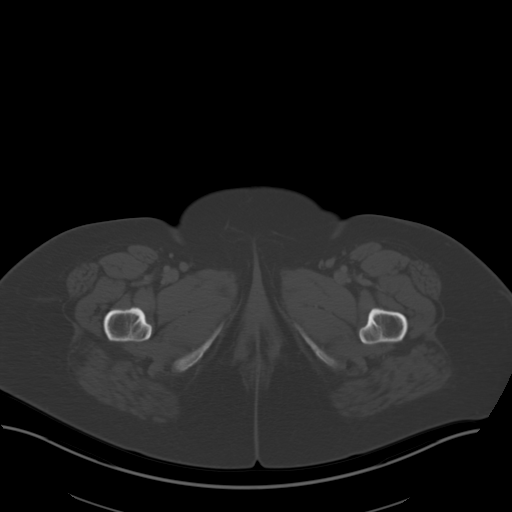
[im 13/91  soft-tissue]
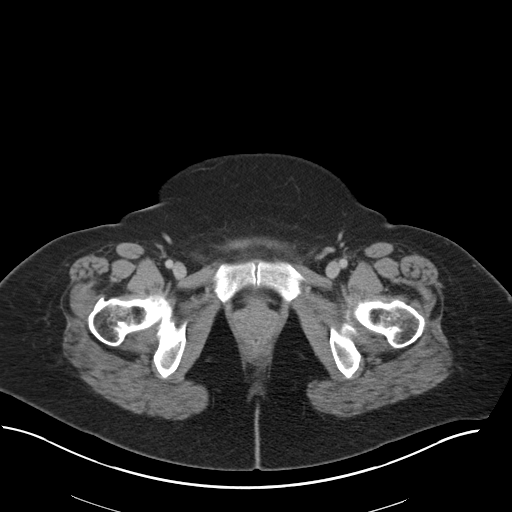
[im 20/91  soft-tissue]
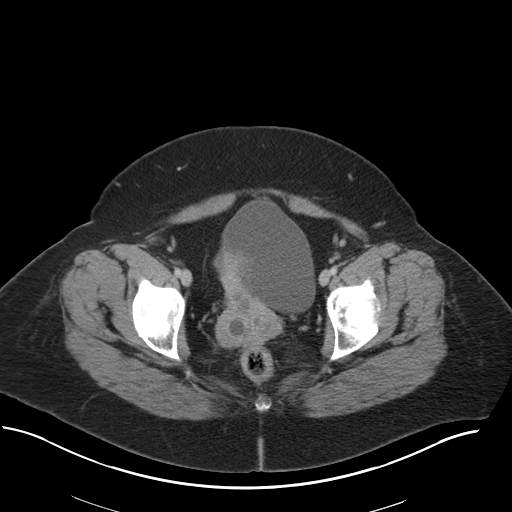
[im 26/91  soft-tissue]
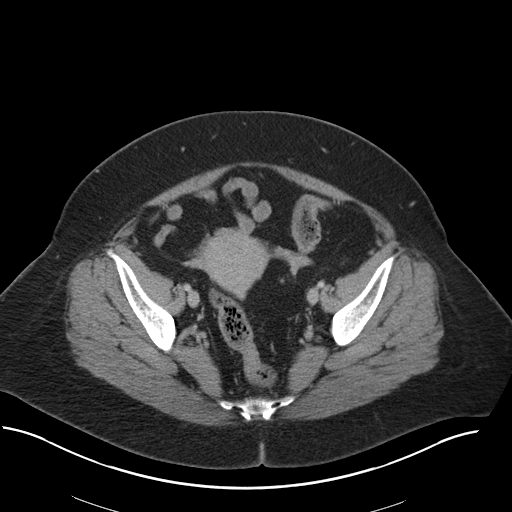
[im 33/91  soft-tissue]
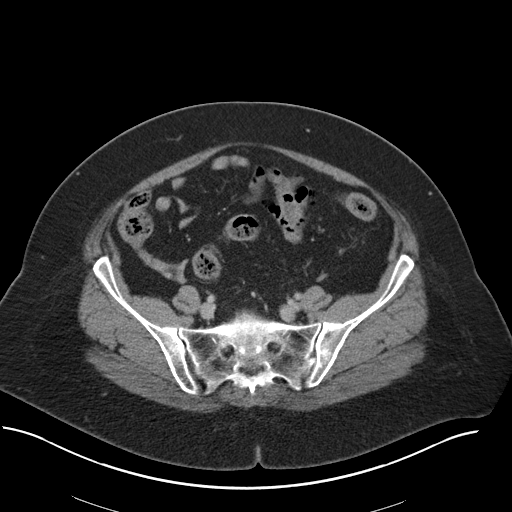
[im 39/91  soft-tissue]
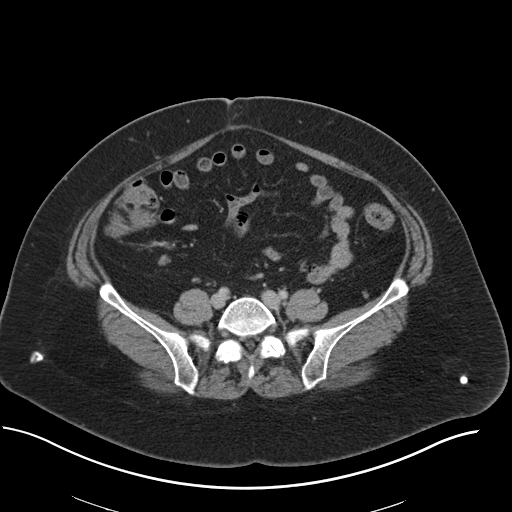
[im 46/91  soft-tissue]
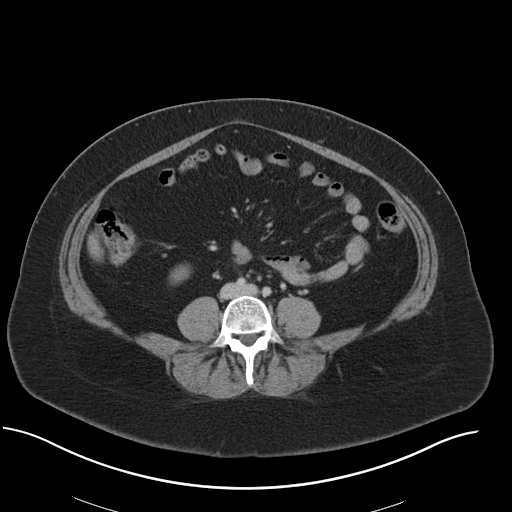
[im 52/91  soft-tissue]
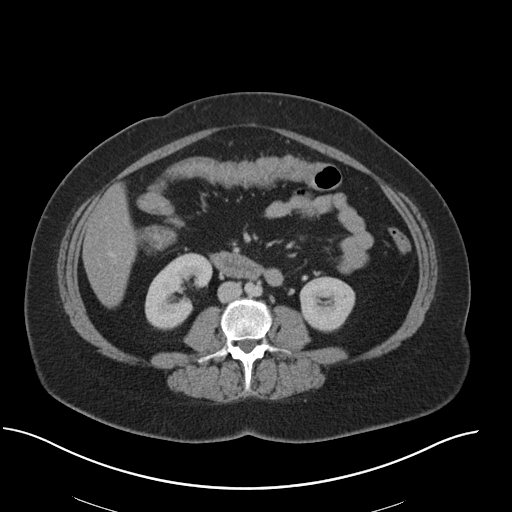
[im 58/91  soft-tissue]
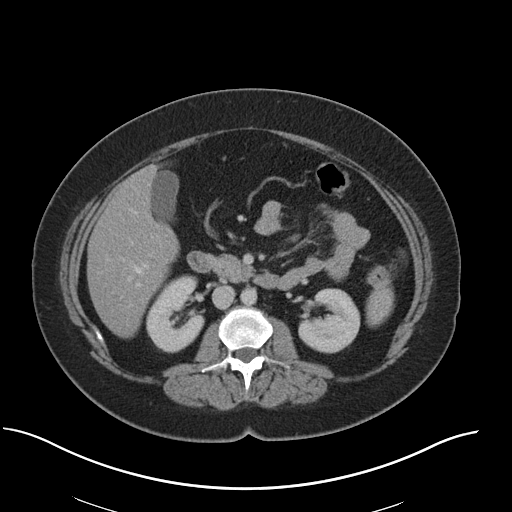
[im 58/91  bone]
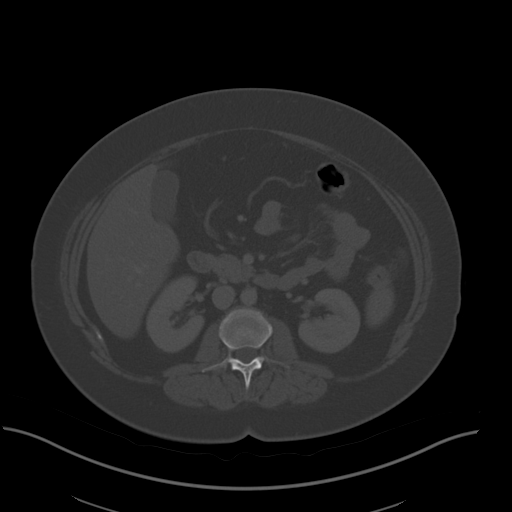
[im 65/91  soft-tissue]
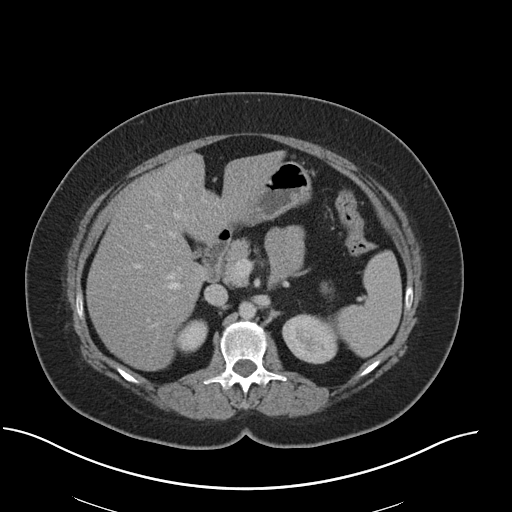
[im 71/91  soft-tissue]
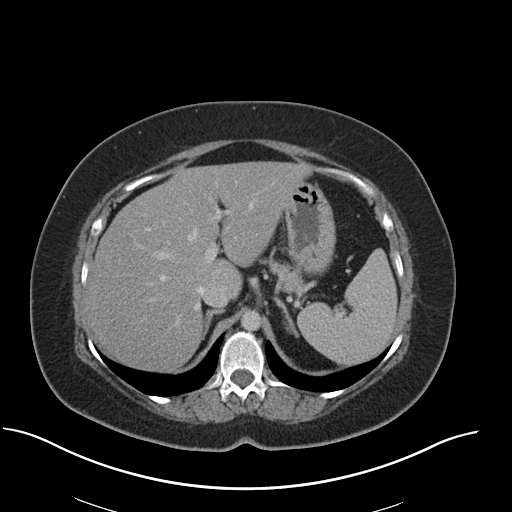
[im 78/91  soft-tissue]
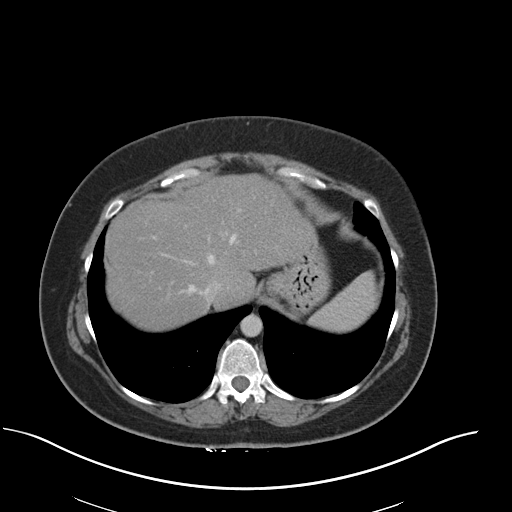
[im 84/91  soft-tissue]
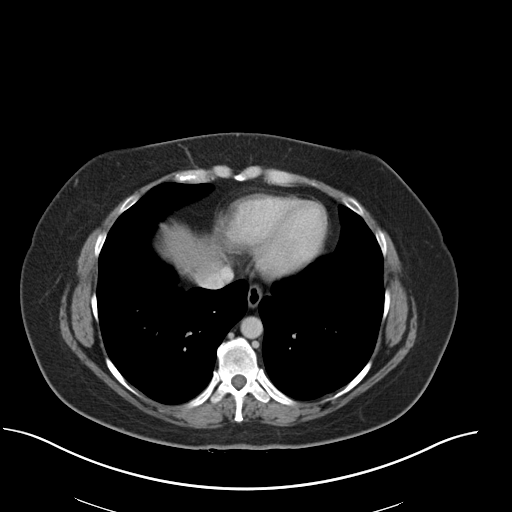

[Series 4: coronal st · coronal · 0.78mm/px · 3 of 166 slices shown]
[im 56/166  soft-tissue]
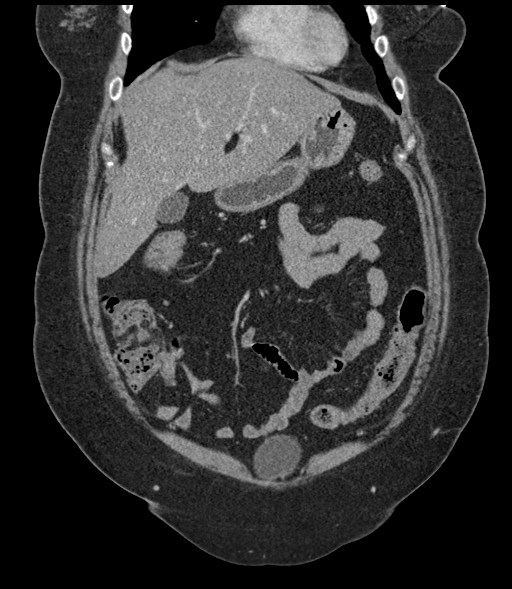
[im 74/166  soft-tissue]
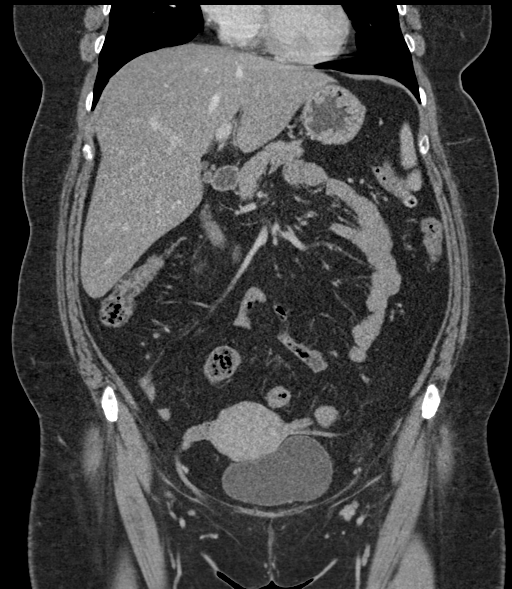
[im 92/166  soft-tissue]
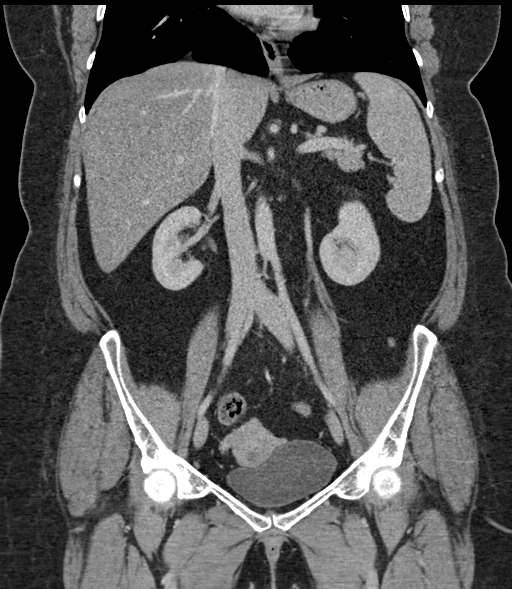

[16 of 46 positions shown; findings below may reference images not displayed]

FINDINGS: Lower chest: No acute abnormality.

Hepatobiliary: 21.8 cm in length mildly steatotic liver without
mass. Increased steatosis since 6016. The gallbladder and bile ducts
unremarkable.

Pancreas: No mass enhancement common ductal dilatation or adjacent
inflammation.

Spleen: Normal in size and enhancement.

Adrenals/Urinary Tract: There is no adrenal mass, no renal cortical
mass enhancement, renal calculus or hydronephrosis. Normal bladder
thickness.

Stomach/Bowel: The gastric wall , unopacified small bowel and
appendix are unremarkable. There is mild wall prominence in the
distal ascending and transverse colon versus changes of
underdistention, without inflammatory change. Left colonic
diverticula without diverticulitis findings.

Vascular/Lymphatic: The abdominal aorta and hepatic portal vein are
unremarkable. Stable shotty subcentimeter mesenteric lymph nodes are
redemonstrated unchanged without evidence of enlarged abdominopelvic
nodes.

Reproductive: The uterus is intact. Multiple cervical nabothian
cysts are again noted, with increased number of cervical cysts since
6016. The ovaries are not enlarged.

Other: Small umbilical and inguinal fat hernias. No free air,
hemorrhage or fluid.

Musculoskeletal: Degenerative disc disease with spondylosis lower
thoracic spine. No worrisome regional skeletal lesion.
IMPRESSION: 1. Colitis versus nondistention in the distal ascending and
transverse colon. No adjacent inflammatory changes. Uncomplicated
diverticula on the left.
2. No other evidence of acute abdominal or pelvic process.
3. Mildly prominent liver with mild increased steatosis since 6016.
No splenomegaly or portal vein dilatation.
4. Increasing cervical nabothian cysts since [DATE]. Umbilical and inguinal fat hernias.

## 2022-11-04 ENCOUNTER — Ambulatory Visit: Payer: 59 | Admitting: Physical Therapy

## 2022-11-27 DIAGNOSIS — J0140 Acute pansinusitis, unspecified: Secondary | ICD-10-CM | POA: Diagnosis not present

## 2022-11-27 DIAGNOSIS — R151 Fecal smearing: Secondary | ICD-10-CM | POA: Diagnosis not present

## 2022-11-27 DIAGNOSIS — B079 Viral wart, unspecified: Secondary | ICD-10-CM | POA: Diagnosis not present

## 2022-11-27 DIAGNOSIS — L308 Other specified dermatitis: Secondary | ICD-10-CM | POA: Diagnosis not present

## 2022-12-11 ENCOUNTER — Ambulatory Visit: Payer: Self-pay | Admitting: Orthopedic Surgery

## 2022-12-11 DIAGNOSIS — J320 Chronic maxillary sinusitis: Secondary | ICD-10-CM | POA: Diagnosis not present

## 2022-12-11 DIAGNOSIS — M5126 Other intervertebral disc displacement, lumbar region: Secondary | ICD-10-CM

## 2022-12-15 ENCOUNTER — Ambulatory Visit: Payer: Self-pay | Admitting: Orthopedic Surgery

## 2022-12-15 NOTE — H&P (Signed)
[name] Mccarthy is an 47 y.o. female.   Chief Complaint: back and left leg pain HPI: Reason for Visit: (normal) visit for: (back) Context: work injury; 13 months 11/12/21 Location (Lower Extremity): lower back pain Severity: pain level 9/10; severe Quality: sharp; aching Aggravating Factors: standing for ; walking for Alleviating Factors: injections; 11/13/22 L5 SNRB Associated Symptoms: numbness/tingling Are you working? 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 ago in [place] - specifics unknown by pt.   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 MRI right x 2, left x1.  RIGHT ACL repair (Dr. Margreta Journey) 2011.  MRI after repair: 03/2009:1.  ACL repair noted with intact graft. 2.  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 3.  Trace knee effusion. 4.  Low-level subcutaneous edema anterior to the patellar tendon and tibial tubercl    Past Surgical History:  Procedure Laterality Date   CESAREAN SECTION  09/03/2002   CHROMOPERTUBATION  08/20/2001   DIAGNOSTIC LAPAROSCOPY  08/20/2001   hemorrhoid removal     KNEE ARTHROSCOPY Bilateral 2011   Rt ACL repair by Dr. Fara Chute   KNEE ARTHROSCOPY WITH SUBCHONDROPLASTY Right 03/13/2017   Procedure: KNEE ARTHROSCOPY WITH SUBCHONDROPLASTY;  Surgeon: Marcene Corning, MD;  Location:  [facility];  Service: Orthopedics;  Laterality: Right;    Family History  Problem Relation Age of Onset   Arthritis Mother        osteo   Osteoporosis Mother    Clotting disorder Father    Asthma Daughter    Social History:  reports that she quit smoking about 15 years ago. Her smoking use included cigarettes. She started smoking about 19 years ago. She has never used smokeless tobacco. She reports that she does not drink alcohol and does not use drugs.  Allergies:  Allergies  Allergen Reactions   Codeine Itching, Palpitations and Shortness Of Breath    Other Reaction(s): GI Intolerance   Hydrocodone Shortness Of Breath, Itching and Palpitations   Oxycodone Shortness Of Breath, Itching and Palpitations   Current meds: cyclobenzaprine 5 mg tablet estradioL 0.1 mg/24 hr weekly transdermal patch lidocaine 5 % topical patch methocarbamoL 500 mg tablet Mobic 15 mg tablet proGESTerone tiZANidine 4 mg tablet  Review of Systems  Constitutional: Negative.   HENT: Negative.    Eyes: Negative.   Respiratory: Negative.    Cardiovascular: Negative.   Gastrointestinal: Negative.   Endocrine: Negative.   Genitourinary: Negative.   Musculoskeletal:  Positive for back pain and gait problem.  Skin: Negative.   Neurological:  Positive for weakness and numbness.  Psychiatric/Behavioral: Negative.      There were no vitals taken for this visit. Physical Exam Constitutional:      Appearance: Normal appearance.  HENT:     Head: Normocephalic and atraumatic.  Right Ear: External ear normal.     Left Ear: External ear normal.     Nose: Nose normal.     Mouth/Throat:     Pharynx: Oropharynx is clear.  Eyes:     Conjunctiva/sclera: Conjunctivae normal.  Cardiovascular:     Rate and Rhythm: Normal rate and regular rhythm.     Pulses: Normal pulses.     Heart sounds: Normal heart sounds.  Pulmonary:     Effort: Pulmonary effort is normal.  Abdominal:     General:  Bowel sounds are normal.  Musculoskeletal:     Cervical back: Normal range of motion.     Comments: Gait and Station: Appearance: ambulating with no assistive devices and antalgic gait.  Constitutional: General Appearance: healthy-appearing and distress (mild).  Psychiatric: Mood and Affect: active and alert.  Cardiovascular System: Edema Right: none; Dorsalis and posterior tibial pulses 2+. Edema Left: none.  Abdomen: Inspection and Palpation: non-distended and no tenderness.  Skin: Inspection and palpation: no rash.  Lumbar Spine: Inspection: normal alignment. Bony Palpation of the Lumbar Spine: tender at lumbosacral junction.. Bony Palpation of the Right Hip: no tenderness of the greater trochanter and tenderness of the SI joint; Pelvis stable. Bony Palpation of the Left Hip: no tenderness of the greater trochanter and tenderness of the SI joint. Soft Tissue Palpation on the Right: No flank pain with percussion. Active Range of Motion: limited flexion and extention.  Motor Strength: L1 Motor Strength on the Right: hip flexion iliopsoas 5/5. L1 Motor Strength on the Left: hip flexion iliopsoas 5/5. L2-L4 Motor Strength on the Right: knee extension quadriceps 5/5. L2-L4 Motor Strength on the Left: knee extension quadriceps 5/5. L5 Motor Strength on the Right: ankle dorsiflexion tibialis anterior 5/5 and great toe extension extensor hallucis longus 5/5. L5 Motor Strength on the Left: ankle dorsiflexion tibialis anterior 5/5 and great toe extension extensor hallucis longus 4/5. S1 Motor Strength on the Right: plantar flexion gastrocnemius 5/5. S1 Motor Strength on the Left: plantar flexion gastrocnemius 5/5.  Neurological System: Knee Reflex Right: normal (2). Knee Reflex Left: normal (2). Ankle Reflex Right: normal (2). Ankle Reflex Left: normal (2). Babinski Reflex Right: plantar reflex absent. Babinski Reflex Left: plantar reflex absent. Sensation on the Right: normal distal extremities.  Sensation on the Left: normal distal extremities. Special Tests on the Right: no clonus of the ankle/knee and seated straight leg raising test positive. Special Tests on the Left: no clonus of the ankle/knee.  Skin:    General: Skin is warm and dry.  Neurological:     Mental Status: She is alert.    CT myelogram indicates small disc protrusion paracentral to the left with ligamentum flavum and facet hypertrophy generating some mild narrowing of the subarticular zone with mass effect on the traversing left L5 nerve root. This was independently reviewed by myself.   Assessment/Plan Impression:  1. Left L5 radiculopathy secondary to disc protrusion lateral recess stenosis at L4-5 to the left with some reported mass effect upon the traversing L5 nerve root 2. EMG nerve conduction study indicating L5-S1 chronic radiculopathy  Plan:  Patient is only 1 year status post her injury and still has left lower extremity radicular pain L5 nerve root distribution. Temporary relief from selective nerve root block. No sustained help from physical therapy activity modification analgesics. She has had a confirmatory EMG and nerve conduction study indicating L5-S1 radiculopathy. And her myelogram showed a lateral recess stenosis at L4-5 and a disc protrusion deflecting the L5 nerve  root  I discussed living with her symptoms and at max medical improvement as she is maximized conservative treatment the other option would be a decompression and microdiscectomy at L4-5 she indicates she is unable to tolerate live with the symptoms and would like to proceed with the discectomy  I had an extensive discussion with the patient concerning the pathology relevant anatomy and treatment options. At this point exhausting conservative treatment and in the presence of a neurologic deficit we discussed microlumbar decompression. I discussed the risks and benefits including bleeding, infection, DVT, PE, anesthetic complications,  worsening in their symptoms, improvement in their symptoms, C SF leakage, epidural fibrosis, need for future surgeries such as revision discectomy and lumbar fusion. I also indicated that this is an operation to basically decompress the nerve roots to allow recovery as opposed to fixing a herniated disc if it is encountered and that the incidence of recurrent chest disc herniation can approach 15%. Also that nerve root recovery is variable and may not recover completely. Any ligament or bone that is contributing to compressing the nerves will be removed as well.  I discussed the operative course including overnight in the hospital. Immediate ambulation. Follow-up in 2 weeks for suture removal. 6 weeks until healing of the herniation and surgical incision followed by 6 weeks of reconditioning and strengthening of the core musculature. Also discussed the need to employ the concepts of disc pressure management and core motion following the surgery to minimize the risk of recurrent disc herniation. We will obtain preoperative clearance i if necessary and proceed accordingly.  She is otherwise healthy. No heart attack stroke. Nothing runs in the family adverse. I would like to obtain a early the mat and hemoglobin A1c just to rule out any underlying occult diabetes.  No history of DVT or MRSA.  At this point I would recommend Light duty to consist of no lifting over 10 pounds, no prolonged sitting or standing over an hour at a time without the ability to change positions, no repetitive or unsupported bending. No climbing. Occasional squatting only.  Plan left hemilaminotomy, microdiscectomy L4-5 left  Dorothy Spark, PA-C for Dr Shelle Iron 12/15/2022, 4:39 PM

## 2022-12-16 ENCOUNTER — Encounter: Payer: Self-pay | Admitting: Physician Assistant

## 2022-12-17 DIAGNOSIS — R238 Other skin changes: Secondary | ICD-10-CM | POA: Diagnosis not present

## 2022-12-17 DIAGNOSIS — L538 Other specified erythematous conditions: Secondary | ICD-10-CM | POA: Diagnosis not present

## 2022-12-17 DIAGNOSIS — B078 Other viral warts: Secondary | ICD-10-CM | POA: Diagnosis not present

## 2022-12-17 DIAGNOSIS — Z789 Other specified health status: Secondary | ICD-10-CM | POA: Diagnosis not present

## 2022-12-23 DIAGNOSIS — E1165 Type 2 diabetes mellitus with hyperglycemia: Secondary | ICD-10-CM | POA: Diagnosis not present

## 2022-12-25 DIAGNOSIS — L648 Other androgenic alopecia: Secondary | ICD-10-CM | POA: Diagnosis not present

## 2023-01-01 DIAGNOSIS — J0191 Acute recurrent sinusitis, unspecified: Secondary | ICD-10-CM | POA: Diagnosis not present

## 2023-01-08 ENCOUNTER — Ambulatory Visit: Admit: 2023-01-08 | Payer: 59 | Admitting: Specialist

## 2023-01-08 SURGERY — LUMBAR LAMINECTOMY/DECOMPRESSION MICRODISCECTOMY 1 LEVEL
Anesthesia: General | Laterality: Left

## 2023-01-21 DIAGNOSIS — E1169 Type 2 diabetes mellitus with other specified complication: Secondary | ICD-10-CM | POA: Diagnosis not present

## 2023-01-21 DIAGNOSIS — E782 Mixed hyperlipidemia: Secondary | ICD-10-CM | POA: Diagnosis not present

## 2023-01-21 DIAGNOSIS — E118 Type 2 diabetes mellitus with unspecified complications: Secondary | ICD-10-CM | POA: Diagnosis not present

## 2023-01-21 DIAGNOSIS — E663 Overweight: Secondary | ICD-10-CM | POA: Diagnosis not present

## 2023-01-22 DIAGNOSIS — J329 Chronic sinusitis, unspecified: Secondary | ICD-10-CM | POA: Diagnosis not present

## 2023-01-22 DIAGNOSIS — J0191 Acute recurrent sinusitis, unspecified: Secondary | ICD-10-CM | POA: Diagnosis not present

## 2023-01-26 DIAGNOSIS — R238 Other skin changes: Secondary | ICD-10-CM | POA: Diagnosis not present

## 2023-01-26 DIAGNOSIS — B078 Other viral warts: Secondary | ICD-10-CM | POA: Diagnosis not present

## 2023-01-26 DIAGNOSIS — L2989 Other pruritus: Secondary | ICD-10-CM | POA: Diagnosis not present

## 2023-01-26 DIAGNOSIS — Z789 Other specified health status: Secondary | ICD-10-CM | POA: Diagnosis not present

## 2023-01-26 DIAGNOSIS — L538 Other specified erythematous conditions: Secondary | ICD-10-CM | POA: Diagnosis not present

## 2023-02-02 DIAGNOSIS — Z1211 Encounter for screening for malignant neoplasm of colon: Secondary | ICD-10-CM | POA: Diagnosis not present

## 2023-02-02 DIAGNOSIS — N898 Other specified noninflammatory disorders of vagina: Secondary | ICD-10-CM | POA: Diagnosis not present

## 2023-02-02 DIAGNOSIS — Z1231 Encounter for screening mammogram for malignant neoplasm of breast: Secondary | ICD-10-CM | POA: Diagnosis not present

## 2023-02-02 DIAGNOSIS — Z79890 Hormone replacement therapy (postmenopausal): Secondary | ICD-10-CM | POA: Diagnosis not present

## 2023-02-02 DIAGNOSIS — M8580 Other specified disorders of bone density and structure, unspecified site: Secondary | ICD-10-CM | POA: Diagnosis not present

## 2023-02-02 DIAGNOSIS — Z6834 Body mass index (BMI) 34.0-34.9, adult: Secondary | ICD-10-CM | POA: Diagnosis not present

## 2023-02-02 DIAGNOSIS — Z01419 Encounter for gynecological examination (general) (routine) without abnormal findings: Secondary | ICD-10-CM | POA: Diagnosis not present

## 2023-02-02 DIAGNOSIS — E2839 Other primary ovarian failure: Secondary | ICD-10-CM | POA: Diagnosis not present

## 2023-02-02 DIAGNOSIS — Z1330 Encounter for screening examination for mental health and behavioral disorders, unspecified: Secondary | ICD-10-CM | POA: Diagnosis not present

## 2023-02-10 ENCOUNTER — Other Ambulatory Visit: Payer: Self-pay | Admitting: Orthopedic Surgery

## 2023-02-10 ENCOUNTER — Ambulatory Visit: Payer: Self-pay | Admitting: Orthopedic Surgery

## 2023-02-10 DIAGNOSIS — R7309 Other abnormal glucose: Secondary | ICD-10-CM

## 2023-03-19 ENCOUNTER — Encounter: Payer: Self-pay | Admitting: Physician Assistant

## 2023-03-19 ENCOUNTER — Ambulatory Visit: Payer: 59 | Admitting: Physician Assistant

## 2023-03-19 VITALS — BP 100/60 | HR 80 | Ht 61.25 in | Wt 161.2 lb

## 2023-03-19 DIAGNOSIS — Z8742 Personal history of other diseases of the female genital tract: Secondary | ICD-10-CM | POA: Diagnosis not present

## 2023-03-19 DIAGNOSIS — N8189 Other female genital prolapse: Secondary | ICD-10-CM

## 2023-03-19 DIAGNOSIS — R151 Fecal smearing: Secondary | ICD-10-CM

## 2023-03-19 NOTE — Patient Instructions (Addendum)
_______________________________________________________  If your blood pressure at your visit was 140/90 or greater, please contact your primary care physician to follow up on this.  _______________________________________________________  If you are age 49 or older, your body mass index should be between 23-30. Your Body mass index is 30.22 kg/m. If this is out of the aforementioned range listed, please consider follow up with your Primary Care Provider.  If you are age 49 or younger, your body mass index should be between 19-25. Your Body mass index is 30.22 kg/m. If this is out of the aformentioned range listed, please consider follow up with your Primary Care Provider.   ________________________________________________________  The [facility] providers would like to encourage you to use [facility] to communicate with providers for non-urgent requests or questions.  Due to long hold times on the telephone, sending your provider a message by [facility] may be a faster and more efficient way to get a response.  Please allow 48 business hours for a response.  Please remember that this is for non-urgent requests.  _______________________________________________________  Please purchase the following medications over the counter and take as directed:  START: Benefiber every day in 8 ounces of water  We have referred you to pelvic floor therapy.  Someone from their office will contact you to schedule.   Thank you for entrusting me with your care and choosing [facility].  Amy Esterwood, PA-C

## 2023-03-22 ENCOUNTER — Encounter: Payer: Self-pay | Admitting: Physician Assistant

## 2023-03-22 NOTE — Progress Notes (Signed)
Subjective:    Patient ID: [name] Mccarthy, female    DOB: 1976-02-25, 48 y.o.   MRN: [id number]  HPI [name] Mccarthy is a 48 year old female, new to GI today referred by [facility] primary care at [facility] Dr. Rosine Door for further evaluation of stool leakage. Patient relates that she did have prior GI evaluation in November 2020 through [facility].  She had colonoscopy which was unremarkable and then also had by her report anal rectal manometry and was told that her sphincter muscle was weak.  I can see that she was seen at [facility] gastroenterology but I cannot see the results of those reports currently. She says that she has had issues for several years and that she is not having any progression of symptoms necessarily.  She is not having any full fecal incontinence but does experience intermittent fecal smearing/small-volume leakage.  She feels this is worse with exercise movement etc.  Interestingly she has not had any episodes over the past couple of weeks but generally will have symptoms once or twice a week.  Bowel movements are generally regular, she has no complaints of constipation or diarrhea, usually has a bowel movement daily.  No complaints of abdominal pain, no heartburn or indigestion. She has not done any pelvic floor physical therapy. She does have history of endometriosis and lumbar disc disease.  She says that she is scheduled for an upcoming L4-L5 surgery within the next couple of months. Family history is negative for colon cancer.  Review of Systems Pertinent positive and negative review of systems were noted in the above HPI section.  All other review of systems was otherwise negative.   Outpatient Encounter Medications as of 03/19/2023  Medication Sig   BD PEN NEEDLE NANO 2ND GEN 32G X 4 MM MISC See admin instructions.   Calcium Carb-Cholecalciferol (CALCIUM-VITAMIN D) 500-200 MG-UNIT tablet Take 1 tablet by mouth daily.   Cholecalciferol (VITAMIN D3) 25 MCG (1000 UT) CAPS  Take by mouth daily.   cyclobenzaprine (FLEXERIL) 5 MG tablet Take 5 mg by mouth as needed.   estradiol (CLIMARA - DOSED IN MG/24 HR) 0.1 mg/24hr patch estradiol 0.1 mg/24 hr weekly transdermal patch  Apply 1 patch every week by transdermal route.   fluconazole (DIFLUCAN) 150 MG tablet Take 1 tablet (150 mg total) by mouth daily.   fluticasone (FLONASE) 50 MCG/ACT nasal spray Place 1 spray into both nostrils daily.   fluticasone (FLOVENT HFA) 110 MCG/ACT inhaler INHALE 2 PUFFS INTO THE LUNGS TWICE A DAY FOR 30 DAYS   ipratropium (ATROVENT) 0.06 % nasal spray Place 2 sprays into both nostrils 4 (four) times daily.   liraglutide (VICTOZA) 18 MG/3ML SOPN Inject 1.2 mg into the skin daily.   Multiple Vitamin (MULTIVITAMIN) tablet Take 1 tablet by mouth daily.   mupirocin ointment (BACTROBAN) 2 % Apply 1 Application topically 2 (two) times daily as needed.   progesterone (PROMETRIUM) 100 MG capsule Take 100 mg by mouth daily.   rosuvastatin (CRESTOR) 10 MG tablet Take 10 mg by mouth daily.   traMADol (ULTRAM) 50 MG tablet Take 1 tablet (50 mg total) by mouth every 12 (twelve) hours as needed.   triamcinolone ointment (KENALOG) 0.1 % APPLY 1 APPLICATION EXTERNALLY TWICE A DAY   YUVAFEM 10 MCG TABS vaginal tablet Place 1 tablet vaginally 2 (two) times a week.   [DISCONTINUED] albuterol (VENTOLIN HFA) 108 (90 Base) MCG/ACT inhaler albuterol sulfate HFA 90 mcg/actuation aerosol inhaler  2 PUFFS BY INHALATION EVERY 6 HOURS AS NEEDED   [  DISCONTINUED] amoxicillin-clavulanate (AUGMENTIN) 875-125 MG tablet Take 1 tablet by mouth every 12 (twelve) hours.   [DISCONTINUED] budesonide-formoterol (SYMBICORT) 80-4.5 MCG/ACT inhaler Inhale 2 puffs into the lungs in the morning and at bedtime.   [DISCONTINUED] montelukast (SINGULAIR) 10 MG tablet Take 10 mg by mouth daily.   [DISCONTINUED] ondansetron (ZOFRAN) 4 MG tablet Take 1 tablet (4 mg total) by mouth every 8 (eight) hours as needed for nausea or vomiting.    [DISCONTINUED] PROGESTERONE PO Take 200 mg by mouth.   No facility-administered encounter medications on file as of 03/19/2023.   Allergies  Allergen Reactions   Codeine Itching, Palpitations and Shortness Of Breath    Other Reaction(s): GI Intolerance   Hydrocodone Shortness Of Breath, Itching and Palpitations   Oxycodone Shortness Of Breath, Itching and Palpitations   Patient Active Problem List   Diagnosis Date Noted   Nasal congestion 01/17/2021   COVID-19 long hauler manifesting chronic cough 11/11/2020   Endometriosis 07/29/2017   Acquired premature ovarian failure 12/02/2014   Female infertility, secondary 01/02/2014   Social History   Socioeconomic History   Marital status: Married    Spouse name: Josip Hmilj   Number of children: 1   Years of education: Not on file   Highest education level: Not on file  Occupational History   Occupation: Paralegal  Tobacco Use   Smoking status: Former    Current packs/day: 0.00    Types: Cigarettes    Start date: 03/03/2003    Quit date: 03/03/2007    Years since quitting: 16.0   Smokeless tobacco: Never   Tobacco comments:    5-6 cigarettes per day  Vaping Use   Vaping status: Never Used  Substance and Sexual Activity   Alcohol use: No   Drug use: No   Sexual activity: Yes    Birth control/protection: None  Other Topics Concern   Not on file  Social History Narrative   From [place]   Came to the [place] in 2000   Lives with her boyfriend (since 2000) and daughter.   Social Drivers of Corporate investment banker Strain: Not on file  Food Insecurity: Not on file  Transportation Needs: Not on file  Physical Activity: Not on file  Stress: Not on file  Social Connections: Not on file  Intimate Partner Violence: Not on file    [name]'s family history includes Arthritis in her mother; Asthma in her daughter; Clotting disorder in her father; Osteoporosis in her mother; Other in her brother.      Objective:     Vitals:   03/19/23 1034  BP: 100/60  Pulse: 80   .Well-developed well-nourished female in no acute distress.  Height, Weight, 161 BMI 30.2 to  HEENT; nontraumatic normocephalic, EOMI, PE R LA, sclera anicteric. Oropharynx; not examined day Neck; supple, no JVD Cardiovascular; regular rate and rhythm with S1-S2, no murmur rub or gallop Pulmonary; Clear bilaterally Abdomen; soft, nontender, nondistended, no palpable mass or hepatosplenomegaly, bowel sounds are active Rectal; external hemorrhoidal tags, mild decreased anal sphincter tone Skin; benign exam, no jaundice rash or appreciable lesions Extremities; no clubbing cyanosis or edema skin warm and dry Neuro/Psych; alert and oriented x4, grossly nonfocal mood and affect appropriate  Physical Exam       Assessment & Plan:   #72 48 year old female with 5-year history of intermittent fecal smearing no significant progression over the past couple of years.  Patient will have episodes of smearing once or twice per week has never  had any full fecal incontinence.  No symptoms over the past 2 weeks. Prior colonoscopy and anal rectal manometry done through [facility] 2020 with negative colonoscopy and manometry per patient showing weak sphincter function.  On exam she does have mild decrease in sphincter tone  #2 lumbar disc disease scheduled for upcoming surgery #3.  History of endometriosis  Plan; patient will sign a release and will obtain copy of the previous anorectal manometry/colonoscopy Add Benefiber 1 dose daily in a glass of water to bulk stool Will place referral to pelvic floor physical therapy which patient is agreeable to. Patient will be established with Dr. Tomasa Rand.  Sammuel Cooper PA-C 03/22/2023   Cc: No ref. provider found

## 2023-03-27 NOTE — Progress Notes (Signed)
Agree with the assessment and plan as outlined by Mike Gip, PA-C.  Yenni Carra E. Tomasa Rand, MD

## 2023-03-30 DIAGNOSIS — R238 Other skin changes: Secondary | ICD-10-CM | POA: Diagnosis not present

## 2023-03-30 DIAGNOSIS — L2989 Other pruritus: Secondary | ICD-10-CM | POA: Diagnosis not present

## 2023-03-30 DIAGNOSIS — L648 Other androgenic alopecia: Secondary | ICD-10-CM | POA: Diagnosis not present

## 2023-03-30 DIAGNOSIS — B078 Other viral warts: Secondary | ICD-10-CM | POA: Diagnosis not present

## 2023-03-30 DIAGNOSIS — L538 Other specified erythematous conditions: Secondary | ICD-10-CM | POA: Diagnosis not present

## 2023-03-30 DIAGNOSIS — Z789 Other specified health status: Secondary | ICD-10-CM | POA: Diagnosis not present

## 2023-04-15 DIAGNOSIS — J300 Vasomotor rhinitis: Secondary | ICD-10-CM | POA: Diagnosis not present

## 2023-04-15 DIAGNOSIS — J3089 Other allergic rhinitis: Secondary | ICD-10-CM | POA: Diagnosis not present

## 2023-04-15 DIAGNOSIS — H1045 Other chronic allergic conjunctivitis: Secondary | ICD-10-CM | POA: Diagnosis not present

## 2023-04-15 DIAGNOSIS — R052 Subacute cough: Secondary | ICD-10-CM | POA: Diagnosis not present

## 2023-06-09 ENCOUNTER — Ambulatory Visit: Payer: 59 | Admitting: Physical Therapy

## 2023-06-10 ENCOUNTER — Ambulatory Visit: Payer: 59 | Admitting: Physical Therapy

## 2023-07-20 ENCOUNTER — Ambulatory Visit: Payer: Self-pay | Admitting: Orthopedic Surgery

## 2023-07-20 DIAGNOSIS — M5126 Other intervertebral disc displacement, lumbar region: Secondary | ICD-10-CM

## 2023-07-23 NOTE — Progress Notes (Signed)
 Surgical Instructions   Your procedure is scheduled on Thursday August 06, 2023. Report to [facility] Main Entrance "A" at 5:30 A.M., then check in with the Admitting office. Any questions or running late day of surgery: call [phone]  Questions prior to your surgery date: call [phone], Monday-Friday, 8am-4pm. If you experience any cold or flu symptoms such as cough, fever, chills, shortness of breath, etc. between now and your scheduled surgery, please notify us  at the above number.     Remember:  Do not eat after midnight the night before your surgery  You may drink clear liquids until 4:30 the morning of your surgery.   Clear liquids allowed are: Water , Non-Citrus Juices (without pulp), Carbonated Beverages, Clear Tea (no milk, honey, etc.), Black Coffee Only (NO MILK, CREAM OR POWDERED CREAMER of any kind), and Gatorade.  Patient Instructions  The night before surgery:  No food after midnight. ONLY clear liquids after midnight  The day of surgery (if you do NOT have diabetes):  Drink ONE (1) Pre-Surgery Clear Ensure by 4:30 the morning of surgery. Drink in one sitting. Do not sip.  This drink was given to you during your hospital  pre-op appointment visit.  Nothing else to drink after completing the  Pre-Surgery Clear Ensure.         If you have questions, please contact your surgeon's office.   Take these medicines the morning of surgery with A SIP OF WATER   progesterone (PROMETRIUM)  rosuvastatin (CRESTOR)   May take these medicines IF NEEDED: fluticasone (FLONASE)   DO NOT TAKE YOUR liraglutide (VICTOZA) FOR 24 HOURS PRIOR TO SURGERY WITH THE LAST DOSE BEING 08/04/2023.    One week prior to surgery, STOP taking any Aspirin (unless otherwise instructed by your surgeon) Aleve, Naproxen, Ibuprofen, Motrin, Advil, Goody's, BC's, all herbal medications, fish oil, and non-prescription vitamins.                     Do NOT Smoke (Tobacco/Vaping) for 24 hours prior to  your procedure.  If you use a CPAP at night, you may bring your mask/headgear for your overnight stay.   You will be asked to remove any contacts, glasses, piercing's, hearing aid's, dentures/partials prior to surgery. Please bring cases for these items if needed.    Patients discharged the day of surgery will not be allowed to drive home, and someone needs to stay with them for 24 hours.  SURGICAL WAITING ROOM VISITATION Patients may have no more than 2 support people in the waiting area - these visitors may rotate.   Pre-op nurse will coordinate an appropriate time for 1 ADULT support person, who may not rotate, to accompany patient in pre-op.  Children under the age of 64 must have an adult with them who is not the patient and must remain in the main waiting area with an adult.  If the patient needs to stay at the hospital during part of their recovery, the visitor guidelines for inpatient rooms apply.  Please refer to the [facility] website for the visitor guidelines for any additional information.   If you received a COVID test during your pre-op visit  it is requested that you wear a mask when out in public, stay away from anyone that may not be feeling well and notify your surgeon if you develop symptoms. If you have been in contact with anyone that has tested positive in the last 10 days please notify you surgeon.  Pre-operative 5 CHG Bathing Instructions   You can play a key role in reducing the risk of infection after surgery. Your skin needs to be as free of germs as possible. You can reduce the number of germs on your skin by washing with CHG (chlorhexidine  gluconate) soap before surgery. CHG is an antiseptic soap that kills germs and continues to kill germs even after washing.   DO NOT use if you have an allergy to chlorhexidine /CHG or antibacterial soaps. If your skin becomes reddened or irritated, stop using the CHG and notify one of our RNs at [phone].   Please  shower with the CHG soap starting 4 days before surgery using the following schedule:     Please keep in mind the following:  DO NOT shave, including legs and underarms, starting the day of your first shower.   You may shave your face at any point before/day of surgery.  Place clean sheets on your bed the day you start using CHG soap. Use a clean washcloth (not used since being washed) for each shower. DO NOT sleep with pets once you start using the CHG.   CHG Shower Instructions:  Wash your face and private area with normal soap. If you choose to wash your hair, wash first with your normal shampoo.  After you use shampoo/soap, rinse your hair and body thoroughly to remove shampoo/soap residue.  Turn the water  OFF and apply about 3 tablespoons (45 ml) of CHG soap to a CLEAN washcloth.  Apply CHG soap ONLY FROM YOUR NECK DOWN TO YOUR TOES (washing for 3-5 minutes)  DO NOT use CHG soap on face, private areas, open wounds, or sores.  Pay special attention to the area where your surgery is being performed.  If you are having back surgery, having someone wash your back for you may be helpful. Wait 2 minutes after CHG soap is applied, then you may rinse off the CHG soap.  Pat dry with a clean towel  Put on clean clothes/pajamas   If you choose to wear lotion, please use ONLY the CHG-compatible lotions that are listed below.  Additional instructions for the day of surgery: DO NOT APPLY any lotions, deodorants, cologne, or perfumes.   Do not bring valuables to the hospital. [facility] is not responsible for any belongings/valuables. Do not wear nail polish, gel polish, artificial nails, or any other type of covering on natural nails (fingers and toes) Do not wear jewelry or makeup Put on clean/comfortable clothes.  Please brush your teeth.  Ask your nurse before applying any prescription medications to the skin.     CHG Compatible Lotions   Aveeno Moisturizing lotion  Cetaphil  Moisturizing Cream  Cetaphil Moisturizing Lotion  Clairol Herbal Essence Moisturizing Lotion, Dry Skin  Clairol Herbal Essence Moisturizing Lotion, Extra Dry Skin  Clairol Herbal Essence Moisturizing Lotion, Normal Skin  Curel Age Defying Therapeutic Moisturizing Lotion with Alpha Hydroxy  Curel Extreme Care Body Lotion  Curel Soothing Hands Moisturizing Hand Lotion  Curel Therapeutic Moisturizing Cream, Fragrance-Free  Curel Therapeutic Moisturizing Lotion, Fragrance-Free  Curel Therapeutic Moisturizing Lotion, Original Formula  Eucerin Daily Replenishing Lotion  Eucerin Dry Skin Therapy Plus Alpha Hydroxy Crme  Eucerin Dry Skin Therapy Plus Alpha Hydroxy Lotion  Eucerin Original Crme  Eucerin Original Lotion  Eucerin Plus Crme Eucerin Plus Lotion  Eucerin TriLipid Replenishing Lotion  Keri Anti-Bacterial Hand Lotion  Keri Deep Conditioning Original Lotion Dry Skin Formula Softly Scented  Keri Deep Conditioning Original Lotion, Fragrance Free Sensitive  Skin Formula  Keri Lotion Fast Absorbing Fragrance Free Sensitive Skin Formula  Keri Lotion Fast Absorbing Softly Scented Dry Skin Formula  Keri Original Lotion  Keri Skin Renewal Lotion Keri Silky Smooth Lotion  Keri Silky Smooth Sensitive Skin Lotion  Nivea Body Creamy Conditioning Oil  Nivea Body Extra Enriched Lotion  Nivea Body Original Lotion  Nivea Body Sheer Moisturizing Lotion Nivea Crme  Nivea Skin Firming Lotion  NutraDerm 30 Skin Lotion  NutraDerm Skin Lotion  NutraDerm Therapeutic Skin Cream  NutraDerm Therapeutic Skin Lotion  ProShield Protective Hand Cream  Provon moisturizing lotion  Please read over the following fact sheets that you were given.

## 2023-07-28 ENCOUNTER — Encounter (HOSPITAL_COMMUNITY): Payer: Self-pay

## 2023-07-28 ENCOUNTER — Ambulatory Visit (HOSPITAL_COMMUNITY)
Admission: RE | Admit: 2023-07-28 | Discharge: 2023-07-28 | Disposition: A | Discharge status: Home / Self Care | Source: Ambulatory Visit | Attending: Orthopedic Surgery | Admitting: Orthopedic Surgery

## 2023-07-28 ENCOUNTER — Other Ambulatory Visit: Payer: Self-pay

## 2023-07-28 ENCOUNTER — Encounter (HOSPITAL_COMMUNITY)
Admission: RE | Admit: 2023-07-28 | Discharge: 2023-07-28 | Disposition: A | Discharge status: Home / Self Care | Payer: Worker's Compensation | Source: Ambulatory Visit | Attending: Specialist | Admitting: Specialist

## 2023-07-28 VITALS — BP 124/56 | HR 66 | Temp 98.3°F | Resp 18 | Ht 61.0 in | Wt 152.9 lb

## 2023-07-28 DIAGNOSIS — Z01818 Encounter for other preprocedural examination: Secondary | ICD-10-CM | POA: Insufficient documentation

## 2023-07-28 DIAGNOSIS — M5126 Other intervertebral disc displacement, lumbar region: Secondary | ICD-10-CM | POA: Diagnosis not present

## 2023-07-28 DIAGNOSIS — E119 Type 2 diabetes mellitus without complications: Secondary | ICD-10-CM | POA: Insufficient documentation

## 2023-07-28 HISTORY — DX: Type 2 diabetes mellitus without complications: E11.9

## 2023-07-28 LAB — SURGICAL PCR SCREEN
MRSA, PCR: NEGATIVE
Staphylococcus aureus: NEGATIVE

## 2023-07-28 LAB — CBC
HCT: 40.9 % (ref 36.0–46.0)
Hemoglobin: 13.7 g/dL (ref 12.0–15.0)
MCH: 31.5 pg (ref 26.0–34.0)
MCHC: 33.5 g/dL (ref 30.0–36.0)
MCV: 94 fL (ref 80.0–100.0)
Platelets: 241 10*3/uL (ref 150–400)
RBC: 4.35 MIL/uL (ref 3.87–5.11)
RDW: 12.1 % (ref 11.5–15.5)
WBC: 7.1 10*3/uL (ref 4.0–10.5)
nRBC: 0 % (ref 0.0–0.2)

## 2023-07-28 LAB — GLUCOSE, CAPILLARY: Glucose-Capillary: 89 mg/dL (ref 70–99)

## 2023-07-28 NOTE — Progress Notes (Signed)
 Surgical Instructions   Your procedure is scheduled on Thursday August 06, 2023. Report to [facility] Main Entrance "A" at 5:30 A.M., then check in with the Admitting office. Any questions or running late day of surgery: call [phone]  Questions prior to your surgery date: call [phone], Monday-Friday, 8am-4pm. If you experience any cold or flu symptoms such as cough, fever, chills, shortness of breath, etc. between now and your scheduled surgery, please notify us  at the above number.     Remember:  Do not eat after midnight the night before your surgery  You may drink clear liquids until 4:30 the morning of your surgery.   Clear liquids allowed are: Water , Non-Citrus Juices (without pulp), Carbonated Beverages, Clear Tea (no milk, honey, etc.), Black Coffee Only (NO MILK, CREAM OR POWDERED CREAMER of any kind), and Gatorade.  Patient Instructions  The night before surgery:  No food after midnight. ONLY clear liquids after midnight   The day of surgery (if you have diabetes): Drink ONE (1) 12 oz G2 given to you in your pre admission testing appointment by 4:30 AM the morning of surgery. Drink in one sitting. Do not sip.  This drink was given to you during your hospital  pre-op appointment visit.  Nothing else to drink after completing the  12 oz bottle of G2.         If you have questions, please contact your surgeon's office.     Take these medicines the morning of surgery with A SIP OF WATER   progesterone (PROMETRIUM)  rosuvastatin (CRESTOR)   May take these medicines IF NEEDED: fluticasone (FLONASE)   DO NOT TAKE YOUR liraglutide (VICTOZA) FOR 24 HOURS PRIOR TO SURGERY WITH THE LAST DOSE BEING 08/04/2023.    One week prior to surgery, STOP taking any Aspirin (unless otherwise instructed by your surgeon) Aleve, Naproxen, Ibuprofen, Motrin, Advil, Goody's, BC's, all herbal medications, fish oil, and non-prescription vitamins.                     Do NOT Smoke  (Tobacco/Vaping) for 24 hours prior to your procedure.  If you use a CPAP at night, you may bring your mask/headgear for your overnight stay.   You will be asked to remove any contacts, glasses, piercing's, hearing aid's, dentures/partials prior to surgery. Please bring cases for these items if needed.    Patients discharged the day of surgery will not be allowed to drive home, and someone needs to stay with them for 24 hours.  SURGICAL WAITING ROOM VISITATION Patients may have no more than 2 support people in the waiting area - these visitors may rotate.   Pre-op nurse will coordinate an appropriate time for 1 ADULT support person, who may not rotate, to accompany patient in pre-op.  Children under the age of 49 must have an adult with them who is not the patient and must remain in the main waiting area with an adult.  If the patient needs to stay at the hospital during part of their recovery, the visitor guidelines for inpatient rooms apply.  Please refer to the [facility] website for the visitor guidelines for any additional information.   If you received a COVID test during your pre-op visit  it is requested that you wear a mask when out in public, stay away from anyone that may not be feeling well and notify your surgeon if you develop symptoms. If you have been in contact with anyone that has tested positive in  the last 10 days please notify you surgeon.      Pre-operative 5 CHG Bathing Instructions   You can play a key role in reducing the risk of infection after surgery. Your skin needs to be as free of germs as possible. You can reduce the number of germs on your skin by washing with CHG (chlorhexidine  gluconate) soap before surgery. CHG is an antiseptic soap that kills germs and continues to kill germs even after washing.   DO NOT use if you have an allergy to chlorhexidine /CHG or antibacterial soaps. If your skin becomes reddened or irritated, stop using the CHG and notify one  of our RNs at [phone].   Please shower with the CHG soap starting 4 days before surgery using the following schedule:     Please keep in mind the following:  DO NOT shave, including legs and underarms, starting the day of your first shower.   You may shave your face at any point before/day of surgery.  Place clean sheets on your bed the day you start using CHG soap. Use a clean washcloth (not used since being washed) for each shower. DO NOT sleep with pets once you start using the CHG.   CHG Shower Instructions:  Wash your face and private area with normal soap. If you choose to wash your hair, wash first with your normal shampoo.  After you use shampoo/soap, rinse your hair and body thoroughly to remove shampoo/soap residue.  Turn the water  OFF and apply about 3 tablespoons (45 ml) of CHG soap to a CLEAN washcloth.  Apply CHG soap ONLY FROM YOUR NECK DOWN TO YOUR TOES (washing for 3-5 minutes)  DO NOT use CHG soap on face, private areas, open wounds, or sores.  Pay special attention to the area where your surgery is being performed.  If you are having back surgery, having someone wash your back for you may be helpful. Wait 2 minutes after CHG soap is applied, then you may rinse off the CHG soap.  Pat dry with a clean towel  Put on clean clothes/pajamas   If you choose to wear lotion, please use ONLY the CHG-compatible lotions that are listed below.  Additional instructions for the day of surgery: DO NOT APPLY any lotions, deodorants, cologne, or perfumes.   Do not bring valuables to the hospital. [facility] is not responsible for any belongings/valuables. Do not wear nail polish, gel polish, artificial nails, or any other type of covering on natural nails (fingers and toes) Do not wear jewelry or makeup Put on clean/comfortable clothes.  Please brush your teeth.  Ask your nurse before applying any prescription medications to the skin.     CHG Compatible Lotions   Aveeno  Moisturizing lotion  Cetaphil Moisturizing Cream  Cetaphil Moisturizing Lotion  Clairol Herbal Essence Moisturizing Lotion, Dry Skin  Clairol Herbal Essence Moisturizing Lotion, Extra Dry Skin  Clairol Herbal Essence Moisturizing Lotion, Normal Skin  Curel Age Defying Therapeutic Moisturizing Lotion with Alpha Hydroxy  Curel Extreme Care Body Lotion  Curel Soothing Hands Moisturizing Hand Lotion  Curel Therapeutic Moisturizing Cream, Fragrance-Free  Curel Therapeutic Moisturizing Lotion, Fragrance-Free  Curel Therapeutic Moisturizing Lotion, Original Formula  Eucerin Daily Replenishing Lotion  Eucerin Dry Skin Therapy Plus Alpha Hydroxy Crme  Eucerin Dry Skin Therapy Plus Alpha Hydroxy Lotion  Eucerin Original Crme  Eucerin Original Lotion  Eucerin Plus Crme Eucerin Plus Lotion  Eucerin TriLipid Replenishing Lotion  Keri Anti-Bacterial Hand Lotion  Keri Deep Conditioning Original Lotion Dry  Skin Formula Softly Scented  Keri Deep Conditioning Original Lotion, Fragrance Free Sensitive Skin Formula  Keri Lotion Fast Absorbing Fragrance Free Sensitive Skin Formula  Keri Lotion Fast Absorbing Softly Scented Dry Skin Formula  Keri Original Lotion  Keri Skin Renewal Lotion Keri Silky Smooth Lotion  Keri Silky Smooth Sensitive Skin Lotion  Nivea Body Creamy Conditioning Oil  Nivea Body Extra Enriched Lotion  Nivea Body Original Lotion  Nivea Body Sheer Moisturizing Lotion Nivea Crme  Nivea Skin Firming Lotion  NutraDerm 30 Skin Lotion  NutraDerm Skin Lotion  NutraDerm Therapeutic Skin Cream  NutraDerm Therapeutic Skin Lotion  ProShield Protective Hand Cream  Provon moisturizing lotion  Please read over the following fact sheets that you were given.

## 2023-07-28 NOTE — Progress Notes (Signed)
 PCP - Dr. Karlton Overly Cardiologist - denies  PPM/ICD - denies   Chest x-ray - 10/11/20 EKG - 07/28/23 Stress Test - denies ECHO - denies Cardiac Cath - denies  Sleep Study - denies   Fasting Blood Sugar - 90-110 Checks Blood Sugar 2-3 times/week  Last dose of GLP1 agonist-  pt instructed to take last dose of Victoza 08/04/23   ASA/Blood Thinner Instructions: n/a   ERAS Protcol - clears until 0630 PRE-SURGERY G2- given  COVID TEST- n/a   Anesthesia review: no  Patient denies shortness of breath, fever, cough and chest pain at PAT appointment   All instructions explained to the patient, with a verbal understanding of the material. Patient agrees to go over the instructions while at home for a better understanding.  The opportunity to ask questions was provided.

## 2023-07-29 ENCOUNTER — Other Ambulatory Visit (HOSPITAL_COMMUNITY)

## 2023-08-06 ENCOUNTER — Other Ambulatory Visit: Payer: Self-pay

## 2023-08-06 ENCOUNTER — Ambulatory Visit (HOSPITAL_COMMUNITY)
Admission: RE | Admit: 2023-08-06 | Discharge: 2023-08-07 | Disposition: A | Discharge status: Home / Self Care | Payer: Worker's Compensation | Attending: Specialist | Admitting: Specialist

## 2023-08-06 ENCOUNTER — Encounter (HOSPITAL_COMMUNITY): Payer: Self-pay | Admitting: Specialist

## 2023-08-06 ENCOUNTER — Ambulatory Visit (HOSPITAL_COMMUNITY)

## 2023-08-06 ENCOUNTER — Encounter (HOSPITAL_COMMUNITY)
Admission: RE | Disposition: A | Discharge status: Home / Self Care | Payer: Self-pay | Source: Home / Self Care | Attending: Specialist

## 2023-08-06 ENCOUNTER — Ambulatory Visit (HOSPITAL_COMMUNITY): Payer: Worker's Compensation

## 2023-08-06 DIAGNOSIS — M5116 Intervertebral disc disorders with radiculopathy, lumbar region: Secondary | ICD-10-CM | POA: Diagnosis not present

## 2023-08-06 DIAGNOSIS — M5126 Other intervertebral disc displacement, lumbar region: Secondary | ICD-10-CM | POA: Diagnosis not present

## 2023-08-06 DIAGNOSIS — E119 Type 2 diabetes mellitus without complications: Secondary | ICD-10-CM

## 2023-08-06 DIAGNOSIS — Z794 Long term (current) use of insulin: Secondary | ICD-10-CM

## 2023-08-06 DIAGNOSIS — Z01818 Encounter for other preprocedural examination: Secondary | ICD-10-CM

## 2023-08-06 DIAGNOSIS — M48061 Spinal stenosis, lumbar region without neurogenic claudication: Secondary | ICD-10-CM | POA: Insufficient documentation

## 2023-08-06 HISTORY — PX: LUMBAR LAMINECTOMY/DECOMPRESSION MICRODISCECTOMY: SHX5026

## 2023-08-06 LAB — GLUCOSE, CAPILLARY
Glucose-Capillary: 124 mg/dL — ABNORMAL HIGH (ref 70–99)
Glucose-Capillary: 190 mg/dL — ABNORMAL HIGH (ref 70–99)
Glucose-Capillary: 222 mg/dL — ABNORMAL HIGH (ref 70–99)

## 2023-08-06 LAB — POCT PREGNANCY, URINE: Preg Test, Ur: NEGATIVE

## 2023-08-06 SURGERY — LUMBAR LAMINECTOMY/DECOMPRESSION MICRODISCECTOMY 1 LEVEL
Anesthesia: General | Laterality: Left

## 2023-08-06 MED ORDER — SUGAMMADEX SODIUM 200 MG/2ML IV SOLN
INTRAVENOUS | Status: DC | PRN
  Administered 2023-08-06: 200 mg via INTRAVENOUS

## 2023-08-06 MED ORDER — CELECOXIB 200 MG PO CAPS: 200.0000 mg | ORAL_CAPSULE | Freq: Once | ORAL | Status: DC

## 2023-08-06 MED ORDER — INSULIN ASPART 100 UNIT/ML IJ SOLN: 0.0000 [IU] | Freq: Three times a day (TID) | INTRAMUSCULAR | Status: DC

## 2023-08-06 MED ORDER — ADULT MULTIVITAMIN W/MINERALS CH: 1.0000 | ORAL_TABLET | Freq: Every day | ORAL | Status: DC

## 2023-08-06 MED ORDER — BISACODYL 5 MG PO TBEC: 5.0000 mg | DELAYED_RELEASE_TABLET | Freq: Every day | ORAL | Status: DC | PRN

## 2023-08-06 MED ORDER — PROPOFOL 10 MG/ML IV BOLUS
INTRAVENOUS | Status: DC | PRN
  Administered 2023-08-06: 150 mg via INTRAVENOUS

## 2023-08-06 MED ORDER — HYDROMORPHONE HCL 1 MG/ML IJ SOLN
1.0000 mg | INTRAMUSCULAR | Status: DC | PRN
  Administered 2023-08-06: 1 mg via INTRAVENOUS
  Filled 2023-08-06: qty 1

## 2023-08-06 MED ORDER — ONDANSETRON HCL 4 MG PO TABS
4.0000 mg | ORAL_TABLET | Freq: Four times a day (QID) | ORAL | Status: DC | PRN
  Administered 2023-08-07: 4 mg via ORAL
  Filled 2023-08-06: qty 1

## 2023-08-06 MED ORDER — FENTANYL CITRATE (PF) 250 MCG/5ML IJ SOLN
INTRAMUSCULAR | Status: DC | PRN
Start: 2023-08-06 — End: 2023-08-06
  Administered 2023-08-06 (×2): 50 ug via INTRAVENOUS

## 2023-08-06 MED ORDER — DOCUSATE SODIUM 100 MG PO CAPS
100.0000 mg | ORAL_CAPSULE | Freq: Two times a day (BID) | ORAL | Status: DC
  Administered 2023-08-06: 100 mg via ORAL
  Filled 2023-08-06: qty 1

## 2023-08-06 MED ORDER — INSULIN ASPART 100 UNIT/ML IJ SOLN
0.0000 [IU] | Freq: Every day | INTRAMUSCULAR | Status: DC
  Administered 2023-08-06: 2 [IU] via SUBCUTANEOUS

## 2023-08-06 MED ORDER — TRANEXAMIC ACID-NACL 1000-0.7 MG/100ML-% IV SOLN
1000.0000 mg | INTRAVENOUS | Status: AC
  Administered 2023-08-06: 1000 mg via INTRAVENOUS
  Filled 2023-08-06: qty 100

## 2023-08-06 MED ORDER — HYDROMORPHONE HCL 2 MG PO TABS
2.0000 mg | ORAL_TABLET | ORAL | Status: DC | PRN
  Administered 2023-08-06 – 2023-08-07 (×3): 2 mg via ORAL
  Filled 2023-08-06 (×3): qty 1

## 2023-08-06 MED ORDER — ACETAMINOPHEN 10 MG/ML IV SOLN
1000.0000 mg | INTRAVENOUS | Status: DC
  Filled 2023-08-06: qty 100

## 2023-08-06 MED ORDER — TRAMADOL HCL 50 MG PO TABS: 50.0000 mg | ORAL_TABLET | Freq: Four times a day (QID) | ORAL | Status: DC | PRN

## 2023-08-06 MED ORDER — PHENYLEPHRINE HCL-NACL 20-0.9 MG/250ML-% IV SOLN
INTRAVENOUS | Status: DC | PRN
  Administered 2023-08-06: 40 ug/min via INTRAVENOUS

## 2023-08-06 MED ORDER — FENTANYL CITRATE (PF) 100 MCG/2ML IJ SOLN
25.0000 ug | INTRAMUSCULAR | Status: DC | PRN
  Administered 2023-08-06 (×2): 50 ug via INTRAVENOUS

## 2023-08-06 MED ORDER — PHENOL 1.4 % MT LIQD: 1.0000 | OROMUCOSAL | Status: DC | PRN

## 2023-08-06 MED ORDER — METHOCARBAMOL 500 MG PO TABS
500.0000 mg | ORAL_TABLET | Freq: Four times a day (QID) | ORAL | Status: DC | PRN
  Administered 2023-08-06 – 2023-08-07 (×3): 500 mg via ORAL
  Filled 2023-08-06 (×3): qty 1

## 2023-08-06 MED ORDER — LACTATED RINGERS IV SOLN: INTRAVENOUS | Status: DC

## 2023-08-06 MED ORDER — ORAL CARE MOUTH RINSE: 15.0000 mL | Freq: Once | OROMUCOSAL | Status: AC

## 2023-08-06 MED ORDER — ACETAMINOPHEN 500 MG PO TABS: 1000.0000 mg | ORAL_TABLET | Freq: Once | ORAL | Status: DC

## 2023-08-06 MED ORDER — RISAQUAD PO CAPS
1.0000 | ORAL_CAPSULE | Freq: Every day | ORAL | Status: DC
  Administered 2023-08-06: 1 via ORAL
  Filled 2023-08-06: qty 1

## 2023-08-06 MED ORDER — ONDANSETRON HCL 4 MG/2ML IJ SOLN
INTRAMUSCULAR | Status: DC | PRN
  Administered 2023-08-06: 4 mg via INTRAVENOUS

## 2023-08-06 MED ORDER — ROCURONIUM BROMIDE 10 MG/ML (PF) SYRINGE
PREFILLED_SYRINGE | INTRAVENOUS | Status: DC | PRN
  Administered 2023-08-06: 50 mg via INTRAVENOUS

## 2023-08-06 MED ORDER — PROPOFOL 10 MG/ML IV BOLUS
INTRAVENOUS | Status: AC
  Filled 2023-08-06: qty 20

## 2023-08-06 MED ORDER — THROMBIN 20000 UNITS EX SOLR
CUTANEOUS | Status: AC
Start: 2023-08-06 — End: ?
  Filled 2023-08-06: qty 20000

## 2023-08-06 MED ORDER — MENTHOL 3 MG MT LOZG: 1.0000 | LOZENGE | OROMUCOSAL | Status: DC | PRN

## 2023-08-06 MED ORDER — HYDROXYZINE HCL 50 MG/ML IM SOLN
50.0000 mg | Freq: Four times a day (QID) | INTRAMUSCULAR | Status: DC | PRN
  Administered 2023-08-06: 50 mg via INTRAMUSCULAR
  Filled 2023-08-06: qty 1

## 2023-08-06 MED ORDER — DIPHENHYDRAMINE HCL 25 MG PO CAPS: 25.0000 mg | ORAL_CAPSULE | Freq: Four times a day (QID) | ORAL | Status: DC | PRN

## 2023-08-06 MED ORDER — LIDOCAINE 2% (20 MG/ML) 5 ML SYRINGE
INTRAMUSCULAR | Status: DC | PRN
  Administered 2023-08-06: 60 mg via INTRAVENOUS

## 2023-08-06 MED ORDER — 0.9 % SODIUM CHLORIDE (POUR BTL) OPTIME
TOPICAL | Status: DC | PRN
  Administered 2023-08-06: 1000 mL

## 2023-08-06 MED ORDER — CEFAZOLIN SODIUM-DEXTROSE 2-4 GM/100ML-% IV SOLN
2.0000 g | INTRAVENOUS | Status: AC
  Administered 2023-08-06: 2 g via INTRAVENOUS
  Filled 2023-08-06: qty 100

## 2023-08-06 MED ORDER — MAGNESIUM CITRATE PO SOLN: 1.0000 | Freq: Once | ORAL | Status: DC | PRN

## 2023-08-06 MED ORDER — BUPIVACAINE-EPINEPHRINE (PF) 0.5% -1:200000 IJ SOLN
INTRAMUSCULAR | Status: AC
  Filled 2023-08-06: qty 30

## 2023-08-06 MED ORDER — DROPERIDOL 2.5 MG/ML IJ SOLN: 0.6250 mg | Freq: Once | INTRAMUSCULAR | Status: DC | PRN

## 2023-08-06 MED ORDER — MIDAZOLAM HCL 2 MG/2ML IJ SOLN
INTRAMUSCULAR | Status: AC
  Filled 2023-08-06: qty 2

## 2023-08-06 MED ORDER — ACETAMINOPHEN 325 MG PO TABS: 650.0000 mg | ORAL_TABLET | ORAL | Status: DC | PRN

## 2023-08-06 MED ORDER — FLUTICASONE PROPIONATE 50 MCG/ACT NA SUSP
1.0000 | Freq: Every day | NASAL | Status: DC | PRN
Start: 2023-08-06 — End: 2023-08-07

## 2023-08-06 MED ORDER — ONDANSETRON HCL 4 MG/2ML IJ SOLN
4.0000 mg | Freq: Four times a day (QID) | INTRAMUSCULAR | Status: DC | PRN
  Administered 2023-08-06: 4 mg via INTRAVENOUS
  Filled 2023-08-06: qty 2

## 2023-08-06 MED ORDER — ALUM & MAG HYDROXIDE-SIMETH 200-200-20 MG/5ML PO SUSP: 30.0000 mL | Freq: Four times a day (QID) | ORAL | Status: DC | PRN

## 2023-08-06 MED ORDER — FENTANYL CITRATE (PF) 250 MCG/5ML IJ SOLN
INTRAMUSCULAR | Status: AC
Start: 2023-08-06 — End: ?
  Filled 2023-08-06: qty 5

## 2023-08-06 MED ORDER — MAGNESIUM OXIDE -MG SUPPLEMENT 400 (240 MG) MG PO TABS
200.0000 mg | ORAL_TABLET | Freq: Every day | ORAL | Status: DC
  Administered 2023-08-06: 200 mg via ORAL
  Filled 2023-08-06: qty 1

## 2023-08-06 MED ORDER — ROSUVASTATIN CALCIUM 20 MG PO TABS: 20.0000 mg | ORAL_TABLET | Freq: Every day | ORAL | Status: DC

## 2023-08-06 MED ORDER — POLYETHYLENE GLYCOL 3350 17 G PO PACK: 17.0000 g | PACK | Freq: Every day | ORAL | Status: DC | PRN

## 2023-08-06 MED ORDER — KETOROLAC TROMETHAMINE 15 MG/ML IJ SOLN
INTRAMUSCULAR | Status: DC | PRN
  Administered 2023-08-06: 15 mg via INTRAVENOUS

## 2023-08-06 MED ORDER — CHLORHEXIDINE GLUCONATE 0.12 % MT SOLN
15.0000 mL | Freq: Once | OROMUCOSAL | Status: AC
  Administered 2023-08-06: 15 mL via OROMUCOSAL
  Filled 2023-08-06: qty 15

## 2023-08-06 MED ORDER — BUPIVACAINE-EPINEPHRINE 0.5% -1:200000 IJ SOLN
INTRAMUSCULAR | Status: DC | PRN
  Administered 2023-08-06: 5 mL

## 2023-08-06 MED ORDER — LIRAGLUTIDE 18 MG/3ML ~~LOC~~ SOPN: 1.8000 mg | PEN_INJECTOR | Freq: Every day | SUBCUTANEOUS | Status: DC

## 2023-08-06 MED ORDER — ACETAMINOPHEN 650 MG RE SUPP: 650.0000 mg | RECTAL | Status: DC | PRN

## 2023-08-06 MED ORDER — DEXAMETHASONE SODIUM PHOSPHATE 10 MG/ML IJ SOLN
INTRAMUSCULAR | Status: DC | PRN
  Administered 2023-08-06: 10 mg via INTRAVENOUS

## 2023-08-06 MED ORDER — KCL IN DEXTROSE-NACL 20-5-0.45 MEQ/L-%-% IV SOLN
INTRAVENOUS | Status: DC
  Filled 2023-08-06: qty 1000

## 2023-08-06 MED ORDER — PROGESTERONE MICRONIZED 100 MG PO CAPS
100.0000 mg | ORAL_CAPSULE | Freq: Every day | ORAL | Status: DC
  Filled 2023-08-06 (×2): qty 1

## 2023-08-06 MED ORDER — METHOCARBAMOL 1000 MG/10ML IJ SOLN: 500.0000 mg | Freq: Four times a day (QID) | INTRAMUSCULAR | Status: DC | PRN

## 2023-08-06 MED ORDER — FENTANYL CITRATE (PF) 100 MCG/2ML IJ SOLN
INTRAMUSCULAR | Status: AC
  Filled 2023-08-06: qty 2

## 2023-08-06 MED ORDER — THROMBIN 20000 UNITS EX SOLR
CUTANEOUS | Status: DC | PRN
  Administered 2023-08-06: 20 mL via TOPICAL

## 2023-08-06 MED ORDER — KETOROLAC TROMETHAMINE 30 MG/ML IJ SOLN
INTRAMUSCULAR | Status: AC
  Filled 2023-08-06: qty 1

## 2023-08-06 MED ORDER — CEFAZOLIN SODIUM-DEXTROSE 2-4 GM/100ML-% IV SOLN
2.0000 g | Freq: Three times a day (TID) | INTRAVENOUS | Status: AC
  Administered 2023-08-06 (×2): 2 g via INTRAVENOUS
  Filled 2023-08-06 (×2): qty 100

## 2023-08-06 MED ORDER — ACETAMINOPHEN 10 MG/ML IV SOLN
INTRAVENOUS | Status: DC | PRN
  Administered 2023-08-06: 1000 mg via INTRAVENOUS

## 2023-08-06 MED ORDER — PHENYLEPHRINE 80 MCG/ML (10ML) SYRINGE FOR IV PUSH (FOR BLOOD PRESSURE SUPPORT)
PREFILLED_SYRINGE | INTRAVENOUS | Status: DC | PRN
  Administered 2023-08-06 (×6): 80 ug via INTRAVENOUS

## 2023-08-06 SURGICAL SUPPLY — 55 items
BAG COUNTER SPONGE SURGICOUNT (BAG) ×1 IMPLANT
BAG DECANTER FOR FLEXI CONT (MISCELLANEOUS) IMPLANT
BAND RUBBER #18 3X1/16 STRL (MISCELLANEOUS) ×2 IMPLANT
BENZOIN TINCTURE PRP APPL 2/3 (GAUZE/BANDAGES/DRESSINGS) IMPLANT
BUR EGG ELITE 5.0 (BURR) IMPLANT
BUR RND DIAMOND ELITE 4.0 (BURR) IMPLANT
CLEANER TIP ELECTROSURG 2X2 (MISCELLANEOUS) ×1 IMPLANT
CNTNR URN SCR LID CUP LEK RST (MISCELLANEOUS) ×1 IMPLANT
DRAPE LAPAROTOMY 100X72X124 (DRAPES) ×1 IMPLANT
DRAPE MICROSCOPE SLANT 54X150 (MISCELLANEOUS) ×1 IMPLANT
DRAPE SHEET LG 3/4 BI-LAMINATE (DRAPES) ×1 IMPLANT
DRAPE SURG 17X11 SM STRL (DRAPES) ×1 IMPLANT
DRAPE UTILITY XL STRL (DRAPES) ×1 IMPLANT
DRSG AQUACEL AG ADV 3.5X 4 (GAUZE/BANDAGES/DRESSINGS) IMPLANT
DRSG AQUACEL AG ADV 3.5X 6 (GAUZE/BANDAGES/DRESSINGS) IMPLANT
DRSG TELFA 3X8 NADH STRL (GAUZE/BANDAGES/DRESSINGS) IMPLANT
DURAPREP 26ML APPLICATOR (WOUND CARE) ×1 IMPLANT
DURASEAL SPINE SEALANT 3ML (MISCELLANEOUS) IMPLANT
ELECTRODE BLDE 4.0 EZ CLN MEGD (MISCELLANEOUS) IMPLANT
ELECTRODE REM PT RTRN 9FT ADLT (ELECTROSURGICAL) ×1 IMPLANT
GLOVE BIOGEL PI IND STRL 7.5 (GLOVE) ×1 IMPLANT
GLOVE INDICATOR 8.0 STRL GRN (GLOVE) IMPLANT
GLOVE SURG SS PI 7.0 STRL IVOR (GLOVE) ×1 IMPLANT
GLOVE SURG SS PI 8.0 STRL IVOR (GLOVE) ×2 IMPLANT
GOWN STRL REUS W/ TWL LRG LVL3 (GOWN DISPOSABLE) ×1 IMPLANT
GOWN STRL REUS W/ TWL XL LVL3 (GOWN DISPOSABLE) ×1 IMPLANT
GOWN STRL SURGICAL XL XLNG (GOWN DISPOSABLE) IMPLANT
IV CATH 14GX2 1/4 (CATHETERS) ×1 IMPLANT
KIT BASIN OR (CUSTOM PROCEDURE TRAY) ×1 IMPLANT
NDL 22X1.5 STRL (OR ONLY) (MISCELLANEOUS) ×1 IMPLANT
NDL SPNL 18GX3.5 QUINCKE PK (NEEDLE) ×2 IMPLANT
NEEDLE 22X1.5 STRL (OR ONLY) (MISCELLANEOUS) ×1 IMPLANT
NEEDLE SPNL 18GX3.5 QUINCKE PK (NEEDLE) ×2 IMPLANT
PACK LAMINECTOMY NEURO (CUSTOM PROCEDURE TRAY) ×1 IMPLANT
PATTIES SURGICAL .25X.25 (GAUZE/BANDAGES/DRESSINGS) IMPLANT
PATTIES SURGICAL .5 X1 (DISPOSABLE) IMPLANT
PATTIES SURGICAL .75X.75 (GAUZE/BANDAGES/DRESSINGS) ×1 IMPLANT
SOLUTION PRONTOSAN WOUND 350ML (IRRIGATION / IRRIGATOR) IMPLANT
SPIKE FLUID TRANSFER (MISCELLANEOUS) IMPLANT
SPONGE SURGIFOAM ABS GEL 100 (HEMOSTASIS) ×1 IMPLANT
SPONGE T-LAP 4X18 ~~LOC~~+RFID (SPONGE) IMPLANT
STAPLER VISISTAT (STAPLE) IMPLANT
STRIP CLOSURE SKIN 1/2X4 (GAUZE/BANDAGES/DRESSINGS) ×1 IMPLANT
SUT NURALON 4 0 TR CR/8 (SUTURE) IMPLANT
SUT PROLENE 3 0 PS 2 (SUTURE) IMPLANT
SUT VIC AB 1 CT1 27XBRD ANTBC (SUTURE) IMPLANT
SUT VIC AB 1-0 CT2 27 (SUTURE) IMPLANT
SUT VIC AB 2-0 CT1 TAPERPNT 27 (SUTURE) IMPLANT
SUT VIC AB 2-0 CT2 27 (SUTURE) IMPLANT
SYR 3ML LL SCALE MARK (SYRINGE) ×1 IMPLANT
TOWEL GREEN STERILE (TOWEL DISPOSABLE) ×1 IMPLANT
TOWEL GREEN STERILE FF (TOWEL DISPOSABLE) ×1 IMPLANT
TRAY FOLEY MTR SLVR 16FR STAT (SET/KITS/TRAYS/PACK) ×1 IMPLANT
WIPE CHG 2% 2PK PREOPERATIVE (MISCELLANEOUS) ×1 IMPLANT
YANKAUER SUCT BULB TIP NO VENT (SUCTIONS) ×1 IMPLANT

## 2023-08-06 NOTE — Anesthesia Postprocedure Evaluation (Signed)
 Anesthesia Post Note  Patient: [name] Mccarthy  Procedure(s) Performed: LEFT HEMILAMINOTOMY, MICRODISCECTOMY LUMBAR FOUR-LUMBAR FIVE (Left)     Patient location during evaluation: PACU Anesthesia Type: General Level of consciousness: awake and alert Pain management: pain level controlled Vital Signs Assessment: post-procedure vital signs reviewed and stable Respiratory status: spontaneous breathing, nonlabored ventilation, respiratory function stable and patient connected to nasal cannula oxygen Cardiovascular status: blood pressure returned to baseline and stable Postop Assessment: no apparent nausea or vomiting Anesthetic complications: no   No notable events documented.  Last Vitals:  Vitals:   08/06/23 1145 08/06/23 1203  BP: 101/63 (!) 111/51  Pulse: 83 78  Resp:  18  Temp: 36.5 C 36.9 C  SpO2: 99% 100%    Last Pain:  Vitals:   08/06/23 1203  TempSrc: Oral  PainSc:                  [name]

## 2023-08-06 NOTE — Anesthesia Procedure Notes (Signed)
 Procedure Name: Intubation Date/Time: 08/06/2023 7:51 AM  Performed by: Chanda Combes, CRNAPre-anesthesia Checklist: Patient identified, Emergency Drugs available, Suction available and Patient being monitored Patient Re-evaluated:Patient Re-evaluated prior to induction Oxygen Delivery Method: Circle System Utilized Preoxygenation: Pre-oxygenation with 100% oxygen Induction Type: IV induction Ventilation: Mask ventilation without difficulty Laryngoscope Size: Miller and 2 Grade View: Grade I Tube type: Oral Tube size: 7.0 mm Number of attempts: 1 Airway Equipment and Method: Stylet Placement Confirmation: ETT inserted through vocal cords under direct vision, positive ETCO2 and breath sounds checked- equal and bilateral Secured at: 21 cm Tube secured with: Tape Dental Injury: Teeth and Oropharynx as per pre-operative assessment

## 2023-08-06 NOTE — Discharge Instructions (Signed)

## 2023-08-06 NOTE — Anesthesia Preprocedure Evaluation (Signed)
 Anesthesia Evaluation  Patient identified by MRN, date of birth, ID band Patient awake    Reviewed: Allergy & Precautions, NPO status , Patient's Chart, lab work & pertinent test results  Airway Mallampati: II  TM Distance: >3 FB Neck ROM: Full    Dental no notable dental hx.    Pulmonary neg pulmonary ROS, former smoker   Pulmonary exam normal        Cardiovascular negative cardio ROS  Rhythm:Regular Rate:Normal     Neuro/Psych negative neurological ROS  negative psych ROS   GI/Hepatic negative GI ROS, Neg liver ROS,,,  Endo/Other  diabetes, Type 2, Insulin Dependent    Renal/GU   negative genitourinary   Musculoskeletal HNP   Abdominal Normal abdominal exam  (+)   Peds  Hematology Lab Results      Component                Value               Date                      WBC                      7.1                 07/28/2023                HGB                      13.7                07/28/2023                HCT                      40.9                07/28/2023                MCV                      94.0                07/28/2023                PLT                      241                 07/28/2023              Anesthesia Other Findings   Reproductive/Obstetrics                             Anesthesia Physical Anesthesia Plan  ASA: 3  Anesthesia Plan: General   Post-op Pain Management: Tylenol PO (pre-op)* and Celebrex PO (pre-op)*   Induction: Intravenous  PONV Risk Score and Plan: 3 and Ondansetron , Dexamethasone , Midazolam  and Treatment may vary due to age or medical condition  Airway Management Planned: Mask and Oral ETT  Additional Equipment: None  Intra-op Plan:   Post-operative Plan: Extubation in OR  Informed Consent: I have reviewed the patients History and Physical, chart, labs and discussed the procedure including the risks, benefits and alternatives for the  proposed anesthesia with the patient or  authorized representative who has indicated his/her understanding and acceptance.     Dental advisory given  Plan Discussed with: CRNA  Anesthesia Plan Comments:        Anesthesia Quick Evaluation

## 2023-08-06 NOTE — Plan of Care (Signed)

## 2023-08-06 NOTE — H&P (Signed)
 [name] Mccarthy is an 48 y.o. female.   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 ago in [place] - specifics unknown by pt.   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 MRI right x 2, left x1.  RIGHT ACL repair (Dr. Jarvis Mesa) 2011.  MRI after repair: 03/2009:1.  ACL repair noted with intact graft. 2.  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 3.  Trace knee effusion. 4.  Low-level subcutaneous edema anterior to the patellar tendon and tibial tubercl    Past Surgical History:  Procedure Laterality Date   CESAREAN SECTION  09/03/2002   CHROMOPERTUBATION  08/20/2001   DIAGNOSTIC LAPAROSCOPY  08/20/2001   hemorrhoid removal     KNEE ARTHROSCOPY Bilateral 2011   Rt ACL repair by Dr. Fain Home   KNEE ARTHROSCOPY WITH SUBCHONDROPLASTY Right 03/13/2017   Procedure: KNEE ARTHROSCOPY WITH SUBCHONDROPLASTY;  Surgeon: Dayne Even, MD;  Location: [facility];  Service: Orthopedics;  Laterality: Right;    Family History  Problem Relation Age of Onset   Arthritis Mother        osteo   Osteoporosis Mother    Clotting disorder Father    Other Brother        Brain tumor   Asthma Daughter    Social History:  reports that she quit smoking about 16 years ago. Her smoking use included cigarettes. She started smoking about 20 years ago. She has never used  smokeless tobacco. She reports that she does not drink alcohol and does not use drugs.  Allergies:  Allergies  Allergen Reactions   Codeine Itching, Palpitations and Shortness Of Breath    Other Reaction(s): GI Intolerance   Hydrocodone Shortness Of Breath, Itching and Palpitations   Oxycodone Shortness Of Breath, Itching and Palpitations    Medications Prior to Admission  Medication Sig Dispense Refill   Calcium Carb-Cholecalciferol (CALCIUM-VITAMIN D) 500-200 MG-UNIT tablet Take 1 tablet by mouth daily.     Cholecalciferol (VITAMIN D3) 25 MCG (1000 UT) CAPS Take by mouth daily.     estradiol (VIVELLE-DOT) 0.1 MG/24HR patch Place 1 patch onto the skin 2 (two) times a week.     fluticasone (FLONASE) 50 MCG/ACT nasal spray Place 1 spray into both nostrils daily as needed for allergies.     liraglutide (VICTOZA) 18 MG/3ML SOPN Inject 1.8 mg into the skin daily.     MAGNESIUM PO Take 1 tablet by mouth at bedtime.     Multiple Vitamin (MULTIVITAMIN) tablet Take 1 tablet by mouth daily.     OVER THE COUNTER MEDICATION Take 1 tablet by mouth daily. Lion's Mane     progesterone (PROMETRIUM) 100 MG capsule Take 100 mg by mouth daily.     rosuvastatin (CRESTOR) 20 MG tablet Take 20 mg by mouth daily.     BD PEN NEEDLE NANO 2ND GEN 32G X  4 MM MISC See admin instructions.     fluconazole  (DIFLUCAN ) 150 MG tablet Take 1 tablet (150 mg total) by mouth daily. (Patient not taking: Reported on 07/23/2023) 1 tablet 0   ipratropium (ATROVENT ) 0.06 % nasal spray Place 2 sprays into both nostrils 4 (four) times daily. (Patient not taking: Reported on 07/23/2023) 15 mL 6   traMADol  (ULTRAM ) 50 MG tablet Take 1 tablet (50 mg total) by mouth every 12 (twelve) hours as needed. (Patient not taking: Reported on 07/23/2023) 30 tablet 2    Results for orders placed or performed during the hospital encounter of 08/06/23 (from the past 48 hours)  Pregnancy, urine POC     Status: None   Collection Time: 08/06/23  6:18  AM  Result Value Ref Range   Preg Test, Ur NEGATIVE NEGATIVE    Comment:        THE SENSITIVITY OF THIS METHODOLOGY IS >24 mIU/mL    No results found.  Review of Systems  Musculoskeletal:  Positive for back pain.  Neurological:  Positive for weakness and numbness.       Straight leg raise left positive for buttock posterior thigh calf pain.  EHLs 4/5 left compared to the right decree sensation in the L5 dermatome    Blood pressure 131/79, pulse 82, temperature 97.9 F (36.6 C), temperature source Oral, resp. rate 17, height 5\' 1"  (1.549 m), weight 69.4 kg, last menstrual period 11/02/2022, SpO2 100%. Physical Exam HENT:     Head: Normocephalic.  Eyes:     Pupils: Pupils are equal, round, and reactive to light.  Cardiovascular:     Rate and Rhythm: Normal rate.  Pulmonary:     Effort: Pulmonary effort is normal.  Abdominal:     General: Abdomen is flat. Bowel sounds are normal.  Musculoskeletal:     Cervical back: Normal range of motion.     Comments: Tender left proximal gluteus.  Straight leg raise left positive for buttock thigh calf pain.  EHL 4/5 left compared to the right decreased sensation L5 dermatome.  Neurological:     Mental Status: She is alert and oriented to person, place, and time.     Motor: Weakness present.  Psychiatric:        Mood and Affect: Mood normal.     MRI demonstrates paracentral disc protrusion L4-5 to the left deflecting left L5 nerve root.  Radiographs demonstrate moderate disc degeneration L4-5  Assessment/Plan 1.  Left lower extremity radicular pain L5 nerve root distribution secondary to disc herniation L4-5 compressing the L5 nerve root refractory  Plan:  Proceed with microdiscectomy L4-5 left.  We discussed risk including bleeding, infection, damage to nerve structures CSF leakage no change in symptoms or symptoms DVT PE anesthetic complications need for fusion in the future etc.  Loel Ring, MD 08/06/2023, 7:19 AM

## 2023-08-06 NOTE — Brief Op Note (Signed)
 08/06/2023  7:24 AM  PATIENT:  [name] Mccarthy  48 y.o. 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  POST-OPERATIVE DIAGNOSIS:  * No post-op diagnosis entered *  PROCEDURE:  Procedure(s) with comments: LUMBAR LAMINECTOMY/DECOMPRESSION MICRODISCECTOMY 1 LEVEL (Left) - Left hemilaminotomy, microdiscectomy L4-5  SURGEON:  Surgeons and Role:    * Orvan Blanch, MD - Primary  PHYSICIAN ASSISTANT:   ASSISTANTS: Bissell   ANESTHESIA:   general  EBL:  20   BLOOD ADMINISTERED:none  DRAINS: none   LOCAL MEDICATIONS USED:  MARCAINE      SPECIMEN:  No Specimen  DISPOSITION OF SPECIMEN:  N/A  COUNTS:  YES  TOURNIQUET:  * No tourniquets in log *  DICTATION: .Other Dictation: Dictation Number  [id number]  PLAN OF CARE: Admit for overnight observation  PATIENT DISPOSITION:  PACU - hemodynamically stable.   Delay start of Pharmacological VTE agent (>24hrs) due to surgical blood loss or risk of bleeding: yes

## 2023-08-06 NOTE — Transfer of Care (Signed)
 Immediate Anesthesia Transfer of Care Note  Patient: [name] Mccarthy  Procedure(s) Performed: LEFT HEMILAMINOTOMY, MICRODISCECTOMY LUMBAR FOUR-LUMBAR FIVE (Left)  Patient Location: PACU  Anesthesia Type:General  Level of Consciousness: awake and sedated  Airway & Oxygen Therapy: Patient Spontanous Breathing and Patient connected to face mask oxygen  Post-op Assessment: Report given to RN and Post -op Vital signs reviewed and stable  Post vital signs: Reviewed and stable  Last Vitals:  Vitals Value Taken Time  BP 121/62 08/06/23 0946  Temp    Pulse 69 08/06/23 0952  Resp 10 08/06/23 0952  SpO2 99 % 08/06/23 0952  Vitals shown include unfiled device data.  Last Pain:  Vitals:   08/06/23 0613  TempSrc:   PainSc: 8       Patients Stated Pain Goal: 4 (08/06/23 1610)  Complications: No notable events documented.

## 2023-08-06 NOTE — Progress Notes (Signed)
 OT Cancellation Note  Patient Details Name: [name] Mccarthy MRN: [id number] DOB: 12-13-1975   Cancelled Treatment:    Reason Eval/Treat Not Completed: Other (comment).  Wanting to wait, currently nauseous, meds given.  Potential to go home tonight.    Trianna Lupien D Stefan Markarian 08/06/2023, 3:48 PM 08/06/2023  RP, OTR/L  Acute Rehabilitation Services  Office:  [phone]

## 2023-08-06 NOTE — Op Note (Unsigned)
 NAMEZOEY, [name] Mccarthy MEDICAL RECORD NO: [id number] ACCOUNT NO: [id number] DATE OF BIRTH: June 01, 1975 FACILITY: [facility] LOCATION: [facility] PHYSICIAN: Loel Ring, MD  Operative Report   DATE OF PROCEDURE: 08/06/2023  PREOPERATIVE DIAGNOSES:  Spinal stenosis; HNP L4-L5, left.  POSTOPERATIVE DIAGNOSES:  Spinal stenosis; HNP L4-L5, left; extensive epidural venous plexus L4-L5, left.  PROCEDURE PERFORMED: 1.  Hemilaminotomy with partial medial hemifacetectomy L4-L5, left. 2. Foraminotomies L4-L5, left. 3.  Microdiscectomy L4-L5, left. 4.  Lysis of extensive epidural venous plexus L4-L5, left.  ANESTHESIA:  General.  ASSISTANT:  Jaclyn Bissell, PA.  HISTORY:  A 48 year old female with L5 radiculopathy secondary to disc herniation, lateral recess stenosis noted by CT myelogram.  Indicated for decompression of the L5 nerve root.  Risks and benefits discussed including bleeding, infection, damage to  neurovascular structures, no change in symptoms, worsening symptoms, DVT, PE, anesthetic complications, etc.  TECHNIQUE:  The patient in the supine position after induction of adequate general anesthesia, 2 g of Kefzol , placed prone on the Wilson frame.  All bony prominences were well padded.  Lumbar region was prepped and draped in usual sterile fashion. Two  18-gauge spinal needles were utilized to localize the L4-L5 interspace, confirmed with x-ray. Incision was made from the spinous process of L4-L5.  Subcutaneous tissue was dissected.  Electrocautery was utilized to achieve hemostasis. Dorsal lumbar  fascia was divided in line with the skin incision.  Paraspinous muscle was elevated from lamina L4-L5.  Operating microscope was draped and brought into the surgical field.  Confirmatory radiographs were obtained.  A small interlaminar window was noted.   We used a high-speed bur to perform a laminotomy of the inferior edge of the L4 lamina preserving the pars.  A straight curette was utilized to  detach the ligamentum flavum from the cephalad edge of L5.  I identified the superior articulating process in  the medial border.  I used a Penfield to enter the epidural space protecting the L5 root.  I then performed a foraminotomy of L5. Protecting the L5 root, I decompressed the lateral recess to the medial border of the pedicle.  A patty had been placed  beneath the ligamentum flavum.  Ligamentum flavum was removed from the interspace. I continued decompressing the lateral recess up to the pedicle of L4 extending to the foramen of L4.  I undercut the inferior aspect of the lamina of L4. There was an  extensive epidural venous plexus noted just beneath the lateral recess, extending out into the foramen of L4 covering the L4 disc and the L5 root.  We meticulously identified each of the veins of the epidural venous plexus and there were multiple veins.   These were cauterized and divided and re-cauterized.  Once this occurred, it freed up the L5 and the L4 nerve roots.  I identified the disc space.  A small focal disc herniation was noted.  I performed an annulotomy. A copious portion of the disc  material was removed from the disc space with micropituitary, further mobilized with catheter lavage and additional fragments were excised.  Confirmatory radiographs were obtained.  After full discectomy of herniated material, I checked the ____ of the  root, beneath the root, the shoulder of the root, out the foramen of L4 and L5 widely patent and the [facility] probe passed above the pedicle of L4, at the foramen of L4 and out the foramen of L5 without any obstruction noted.  There was 1 cm of excursion  of the L5 root medial  to the pedicle without tension. Copiously irrigated.  Thrombin-soaked Gelfoam was placed on the laminotomy defect and then retrieved.  No evidence of active bleeding or CSF leakage. I then removed the [facility] retractor, irrigated  the paraspinous musculature.  Any minor bleeding was  cauterized.  Thrombin-soaked Gelfoam was placed on the laminotomy defect and then retrieved. I closed the dorsal lumbar fascia with 1 Vicryl interrupted figure-of-eight sutures, subcutaneous tissue  with 2-0, and skin with Prolene.  Sterile dressing was applied.  The patient was placed supine in the hospital bed, extubated without difficulty, and transported to the recovery room in satisfactory condition.  The patient tolerated the procedure well, no complications.  Assistant, Jaclyn Bissell, PA was used throughout the case for patient positioning, general intermittent neural retraction, suction, and closure.  BLOOD LOSS:  20 mL.   NIK D: 08/06/2023 9:49:54 am T: 08/06/2023 10:34:00 am  JOB: [id number]/ [id number]

## 2023-08-07 ENCOUNTER — Encounter (HOSPITAL_COMMUNITY): Payer: Self-pay | Admitting: Specialist

## 2023-08-07 DIAGNOSIS — M48061 Spinal stenosis, lumbar region without neurogenic claudication: Secondary | ICD-10-CM | POA: Diagnosis not present

## 2023-08-07 LAB — GLUCOSE, CAPILLARY: Glucose-Capillary: 118 mg/dL — ABNORMAL HIGH (ref 70–99)

## 2023-08-07 MED ORDER — METHOCARBAMOL 500 MG PO TABS: 500.0000 mg | ORAL_TABLET | Freq: Three times a day (TID) | ORAL | 1 refills | Status: AC | PRN

## 2023-08-07 MED ORDER — ONDANSETRON HCL 4 MG PO TABS: 4.0000 mg | ORAL_TABLET | Freq: Four times a day (QID) | ORAL | 0 refills | Status: AC | PRN

## 2023-08-07 MED ORDER — POLYETHYLENE GLYCOL 3350 17 G PO PACK: 17.0000 g | PACK | Freq: Every day | ORAL | 0 refills | Status: AC

## 2023-08-07 MED ORDER — DOCUSATE SODIUM 100 MG PO CAPS: 100.0000 mg | ORAL_CAPSULE | Freq: Two times a day (BID) | ORAL | 2 refills | Status: AC

## 2023-08-07 MED ORDER — HYDROMORPHONE HCL 2 MG PO TABS: 2.0000 mg | ORAL_TABLET | ORAL | 0 refills | Status: AC | PRN

## 2023-08-07 NOTE — Progress Notes (Signed)
 Subjective: 1 Day Post-Op Procedure(s) (LRB): LEFT HEMILAMINOTOMY, MICRODISCECTOMY LUMBAR FOUR-LUMBAR FIVE (Left) Patient reports pain as mild.  Nausea some improved, better with zofran .  Objective: Vital signs in last 24 hours: Temp:  [97.5 F (36.4 C)-98.5 F (36.9 C)] 97.6 F (36.4 C) (06/06 0618) Pulse Rate:  [64-86] 66 (06/06 0618) Resp:  [11-18] 18 (06/06 0618) BP: (94-121)/(46-63) 104/53 (06/06 0618) SpO2:  [93 %-100 %] 100 % (06/06 0618)  Intake/Output from previous day: 06/05 0701 - 06/06 0700 In: 1190 [P.O.:240; I.V.:950] Out: 85 [Urine:60; Blood:25] Intake/Output this shift: No intake/output data recorded.  No results for input(s): "HGB" in the last 72 hours. No results for input(s): "WBC", "RBC", "HCT", "PLT" in the last 72 hours. No results for input(s): "NA", "K", "CL", "CO2", "BUN", "CREATININE", "GLUCOSE", "CALCIUM" in the last 72 hours. No results for input(s): "LABPT", "INR" in the last 72 hours.  Neurologically intact ABD soft Neurovascular intact Sensation intact distally Intact pulses distally Dorsiflexion/Plantar flexion intact Incision: dressing C/D/I and no drainage No cellulitis present Compartment soft No calf pain or sign of DVT   Assessment/Plan: 1 Day Post-Op Procedure(s) (LRB): LEFT HEMILAMINOTOMY, MICRODISCECTOMY LUMBAR FOUR-LUMBAR FIVE (Left) Advance diet Up with therapy D/C IV fluids D/C to home Discussed D/C instructions Will add zofran    Dolly Harbach M Aaryn Parrilla 08/07/2023, 8:24 AM

## 2023-08-07 NOTE — Evaluation (Addendum)
 Occupational Therapy Evaluation and Discharge Patient Details Name: [name] Mccarthy MRN: [id number] DOB: 1975/10/01 Today's Date: 08/07/2023   History of Present Illness   This 48 yo female is s/p hemilaminotomy with partial medial hemifacetectomy, foraminotomies, microdiscectomy, and lysis of epidural venous plexus at L4-L5, left. PHMx: DM2     Clinical Impressions This 48 yo female admitted and underwent above presents to acute OT with all education completed and post op handout provided and reviewed. No further OT needs, we will sign off.  BP sitting             123/47 (66)   HR 63 BP standing (0)   104/60 (74)   HR 75 BP standing (3)    106/64 (76)   HR 76 Pt c/o of mild dizziness     If plan is discharge home, recommend the following:   Assistance with cooking/housework;Assist for transportation     Functional Status Assessment   Patient has had a recent decline in their functional status and demonstrates the ability to make significant improvements in function in a reasonable and predictable amount of time. (without further need for skilled OT)     Equipment Recommendations   None recommended by OT      Precautions/Restrictions   Precautions Precautions: Back Precaution Booklet Issued: Yes (comment) Recall of Precautions/Restrictions: Intact Required Braces or Orthoses:  (no brace needed per orders) Restrictions Weight Bearing Restrictions Per Provider Order: No     Mobility Bed Mobility Overal bed mobility: Modified Independent             General bed mobility comments: VCs for technique    Transfers Overall transfer level: Modified independent                 General transfer comment: increased time      Balance Overall balance assessment: Mild deficits observed, not formally tested                                         ADL either performed or assessed with clinical judgement   ADL                                          General ADL Comments: Educated pt on use of 2 cups for brushing teeth, use of wet wipes for back peri care, building up sitting tolerance, positioning in bed with adjustable bed or pillows, sit<>stand stance to keep back more straight     Vision Patient Visual Report: No change from baseline              Pertinent Vitals/Pain Pain Assessment Pain Assessment: 0-10 Pain Score: 6  Pain Location: back and right hip Pain Descriptors / Indicators: Aching, Guarding, Sore Pain Intervention(s): Limited activity within patient's tolerance, Monitored during session, Premedicated before session, Ice applied     Extremity/Trunk Assessment Upper Extremity Assessment Upper Extremity Assessment: Overall WFL for tasks assessed           Communication Communication Communication: No apparent difficulties   Cognition Arousal: Alert Behavior During Therapy: WFL for tasks assessed/performed Cognition: No apparent impairments                               Following commands:  Intact                  Home Living Family/patient expects to be discharged to:: Private residence Living Arrangements: Spouse/significant other Available Help at Discharge: Family;Available 24 hours/day (for a week) Type of Home: Apartment Home Access: Stairs to enter Entrance Stairs-Number of Steps: 14 Entrance Stairs-Rails: Left Home Layout: One level     Bathroom Shower/Tub: IT trainer: Standard     Home Equipment: Hand held shower head          Prior Functioning/Environment Prior Level of Function : Independent/Modified Independent;Driving                    OT Problem List: Decreased range of motion;Pain        OT Goals(Current goals can be found in the care plan section)   Acute Rehab OT Goals Patient Stated Goal: to go home today         AM-PAC OT "6 Clicks" Daily Activity     Outcome Measure Help  from another person eating meals?: None Help from another person taking care of personal grooming?: None Help from another person toileting, which includes using toliet, bedpan, or urinal?: None Help from another person bathing (including washing, rinsing, drying)?: None Help from another person to put on and taking off regular upper body clothing?: None Help from another person to put on and taking off regular lower body clothing?: None 6 Click Score: 24   End of Session Equipment Utilized During Treatment: Gait belt Nurse Communication: Mobility status (no OT needs)  Activity Tolerance: Patient tolerated treatment well Patient left: in chair;with call bell/phone within reach  OT Visit Diagnosis: Unsteadiness on feet (R26.81);Pain Pain - part of body:  (incisional and right hip)                Time: 1610-9604 OT Time Calculation (min): 27 min Charges:  OT General Charges $OT Visit: 1 Visit OT Evaluation $OT Eval Moderate Complexity: 1 Mod OT Treatments $Self Care/Home Management : 8-22 mins  Merryl Abraham OT Acute Rehabilitation Services Office [phone]    Lenox Raider 08/07/2023, 9:57 AM

## 2023-08-07 NOTE — Evaluation (Signed)
 Physical Therapy Evaluation Patient Details Name: [name] Mccarthy MRN: [id number] DOB: 02-28-1976 Today's Date: 08/07/2023  History of Present Illness  This 48 yo female is s/p hemilaminotomy with partial medial hemifacetectomy, foraminotomies, microdiscectomy, and lysis of epidural venous plexus at L4-L5, left. PHMx: DM2  Clinical Impression  PT eval complete. Pt mod I bed mobility and transfers. Supervision amb 500' without AD, and supervision ascend/descend 12 steps with L rail. All education complete. Plan is for d/c home today. No follow up services or DME needs. PT signing off.        If plan is discharge home, recommend the following: Assist for transportation;Assistance with cooking/housework;Help with stairs or ramp for entrance   Can travel by private vehicle        Equipment Recommendations None recommended by PT  Recommendations for Other Services       Functional Status Assessment Patient has had a recent decline in their functional status and demonstrates the ability to make significant improvements in function in a reasonable and predictable amount of time.     Precautions / Restrictions Precautions Precautions: Back Recall of Precautions/Restrictions: Intact Restrictions Other Position/Activity Restrictions: no brace      Mobility  Bed Mobility Overal bed mobility: Modified Independent                  Transfers Overall transfer level: Modified independent                 General transfer comment: increased time    Ambulation/Gait Ambulation/Gait assistance: Supervision Gait Distance (Feet): 500 Feet Assistive device: None Gait Pattern/deviations: Step-through pattern, Decreased stride length Gait velocity: decreased Gait velocity interpretation: 1.31 - 2.62 ft/sec, indicative of limited community ambulator   General Gait Details: occasional use of handrail  Stairs Stairs: Yes Stairs assistance: Supervision Stair Management: One  rail Left, Forwards, Step to pattern Number of Stairs: 12    Wheelchair Mobility     Tilt Bed    Modified Rankin (Stroke Patients Only)       Balance Overall balance assessment: Mild deficits observed, not formally tested                                           Pertinent Vitals/Pain Pain Assessment Pain Assessment: 0-10 Pain Score: 6  Pain Location: back Pain Descriptors / Indicators: Aching, Guarding, Sore Pain Intervention(s): Monitored during session, Ice applied    Home Living Family/patient expects to be discharged to:: Private residence Living Arrangements: Spouse/significant other Available Help at Discharge: Family;Available 24 hours/day Type of Home: Apartment Home Access: Stairs to enter Entrance Stairs-Rails: Left Entrance Stairs-Number of Steps: 14   Home Layout: One level Home Equipment: Hand held shower head      Prior Function Prior Level of Function : Independent/Modified Independent;Driving                     Extremity/Trunk Assessment   Upper Extremity Assessment Upper Extremity Assessment: Defer to OT evaluation    Lower Extremity Assessment Lower Extremity Assessment: Generalized weakness    Cervical / Trunk Assessment Cervical / Trunk Assessment: Back Surgery  Communication   Communication Communication: No apparent difficulties    Cognition Arousal: Alert Behavior During Therapy: WFL for tasks assessed/performed   PT - Cognitive impairments: No apparent impairments  Following commands: Intact       Cueing       General Comments      Exercises     Assessment/Plan    PT Assessment Patient does not need any further PT services  PT Problem List         PT Treatment Interventions      PT Goals (Current goals can be found in the Care Plan section)  Acute Rehab PT Goals Patient Stated Goal: return to the gym PT Goal Formulation: All assessment and  education complete, DC therapy    Frequency       Co-evaluation               AM-PAC PT "6 Clicks" Mobility  Outcome Measure Help needed turning from your back to your side while in a flat bed without using bedrails?: None Help needed moving from lying on your back to sitting on the side of a flat bed without using bedrails?: None Help needed moving to and from a bed to a chair (including a wheelchair)?: None Help needed standing up from a chair using your arms (e.g., wheelchair or bedside chair)?: None Help needed to walk in hospital room?: A Little Help needed climbing 3-5 steps with a railing? : A Little 6 Click Score: 22    End of Session Equipment Utilized During Treatment: Gait belt Activity Tolerance: Patient tolerated treatment well Patient left: in chair;with call bell/phone within reach;with family/visitor present Nurse Communication: Mobility status PT Visit Diagnosis: Difficulty in walking, not elsewhere classified (R26.2)    Time: 0960-4540 PT Time Calculation (min) (ACUTE ONLY): 23 min   Charges:   PT Evaluation $PT Eval Moderate Complexity: 1 Mod   PT General Charges $$ ACUTE PT VISIT: 1 Visit         [name], PT  Office # [phone]   Guadelupe Leech 08/07/2023, 10:15 AM

## 2023-08-07 NOTE — Progress Notes (Signed)
 Patient alert and oriented, mae's well, voiding adequate amount of urine, swallowing without difficulty, no c/o pain at time of discharge. Patient discharged home with family. Script and discharged instructions given to patient. Patient and family stated understanding of instructions given. Room was checked and accounted for all patient's belongings; discharge instructions concerning his medications, incision care, follow up appointment and when to call the doctor as needed were all discussed with patient by RN and she expressed understanding on the instructions given

## 2023-08-25 ENCOUNTER — Ambulatory Visit: Admitting: Physical Therapy

## 2023-10-07 DIAGNOSIS — E785 Hyperlipidemia, unspecified: Secondary | ICD-10-CM | POA: Diagnosis not present

## 2023-10-07 DIAGNOSIS — M5442 Lumbago with sciatica, left side: Secondary | ICD-10-CM | POA: Diagnosis not present

## 2023-10-14 DIAGNOSIS — J0100 Acute maxillary sinusitis, unspecified: Secondary | ICD-10-CM | POA: Diagnosis not present

## 2023-10-28 DIAGNOSIS — H04123 Dry eye syndrome of bilateral lacrimal glands: Secondary | ICD-10-CM | POA: Diagnosis not present

## 2023-10-28 DIAGNOSIS — D3131 Benign neoplasm of right choroid: Secondary | ICD-10-CM | POA: Diagnosis not present

## 2023-11-04 DIAGNOSIS — R238 Other skin changes: Secondary | ICD-10-CM | POA: Diagnosis not present

## 2023-11-04 DIAGNOSIS — Z789 Other specified health status: Secondary | ICD-10-CM | POA: Diagnosis not present

## 2023-11-04 DIAGNOSIS — L659 Nonscarring hair loss, unspecified: Secondary | ICD-10-CM | POA: Diagnosis not present

## 2023-11-04 DIAGNOSIS — B078 Other viral warts: Secondary | ICD-10-CM | POA: Diagnosis not present

## 2023-11-04 DIAGNOSIS — L2989 Other pruritus: Secondary | ICD-10-CM | POA: Diagnosis not present

## 2023-11-04 DIAGNOSIS — L538 Other specified erythematous conditions: Secondary | ICD-10-CM | POA: Diagnosis not present

## 2023-11-26 ENCOUNTER — Other Ambulatory Visit: Payer: Self-pay | Admitting: Obstetrics and Gynecology

## 2023-11-26 DIAGNOSIS — Z1231 Encounter for screening mammogram for malignant neoplasm of breast: Secondary | ICD-10-CM

## 2023-12-03 ENCOUNTER — Other Ambulatory Visit: Payer: Self-pay

## 2023-12-03 ENCOUNTER — Encounter: Payer: Self-pay | Admitting: Physical Therapy

## 2023-12-03 ENCOUNTER — Ambulatory Visit: Attending: Physician Assistant | Admitting: Physical Therapy

## 2023-12-03 DIAGNOSIS — M6281 Muscle weakness (generalized): Secondary | ICD-10-CM | POA: Insufficient documentation

## 2023-12-03 DIAGNOSIS — R252 Cramp and spasm: Secondary | ICD-10-CM | POA: Insufficient documentation

## 2023-12-03 DIAGNOSIS — R2689 Other abnormalities of gait and mobility: Secondary | ICD-10-CM | POA: Diagnosis not present

## 2023-12-03 DIAGNOSIS — M5459 Other low back pain: Secondary | ICD-10-CM | POA: Diagnosis not present
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
## 2024-02-01 ENCOUNTER — Ambulatory Visit
Admission: RE | Admit: 2024-02-01 | Discharge: 2024-02-01 | Disposition: A | Source: Ambulatory Visit | Attending: Obstetrics and Gynecology | Admitting: Obstetrics and Gynecology

## 2024-02-01 DIAGNOSIS — Z1231 Encounter for screening mammogram for malignant neoplasm of breast: Secondary | ICD-10-CM | POA: Diagnosis not present

## 2024-02-09 DIAGNOSIS — N898 Other specified noninflammatory disorders of vagina: Secondary | ICD-10-CM | POA: Diagnosis not present

## 2024-02-09 DIAGNOSIS — Z1330 Encounter for screening examination for mental health and behavioral disorders, unspecified: Secondary | ICD-10-CM | POA: Diagnosis not present

## 2024-02-09 DIAGNOSIS — Z1211 Encounter for screening for malignant neoplasm of colon: Secondary | ICD-10-CM | POA: Diagnosis not present

## 2024-02-09 DIAGNOSIS — M8580 Other specified disorders of bone density and structure, unspecified site: Secondary | ICD-10-CM | POA: Diagnosis not present

## 2024-02-09 DIAGNOSIS — Z1342 Encounter for screening for global developmental delays (milestones): Secondary | ICD-10-CM | POA: Diagnosis not present

## 2024-02-09 DIAGNOSIS — E2839 Other primary ovarian failure: Secondary | ICD-10-CM | POA: Diagnosis not present

## 2024-02-09 DIAGNOSIS — N951 Menopausal and female climacteric states: Secondary | ICD-10-CM | POA: Diagnosis not present

## 2024-02-09 DIAGNOSIS — Z79890 Hormone replacement therapy (postmenopausal): Secondary | ICD-10-CM | POA: Diagnosis not present

## 2024-02-09 DIAGNOSIS — Z1231 Encounter for screening mammogram for malignant neoplasm of breast: Secondary | ICD-10-CM | POA: Diagnosis not present

## 2024-02-09 DIAGNOSIS — Z01419 Encounter for gynecological examination (general) (routine) without abnormal findings: Secondary | ICD-10-CM | POA: Diagnosis not present

## 2024-02-09 DIAGNOSIS — Z6834 Body mass index (BMI) 34.0-34.9, adult: Secondary | ICD-10-CM | POA: Diagnosis not present

## 2024-02-23 DIAGNOSIS — J0101 Acute recurrent maxillary sinusitis: Secondary | ICD-10-CM | POA: Diagnosis not present
